# Patient Record
Sex: Female | Born: 1945 | Race: White | Hispanic: No | Marital: Married | Smoking: Former smoker
Health system: Southern US, Community
[De-identification: ages and names within clinical notes are randomized; demographics above are authoritative.]

## PROBLEM LIST (undated history)

## (undated) DIAGNOSIS — K219 Gastro-esophageal reflux disease without esophagitis: Secondary | ICD-10-CM

## (undated) DIAGNOSIS — H269 Unspecified cataract: Secondary | ICD-10-CM

## (undated) DIAGNOSIS — E079 Disorder of thyroid, unspecified: Secondary | ICD-10-CM

## (undated) DIAGNOSIS — T7840XA Allergy, unspecified, initial encounter: Secondary | ICD-10-CM

## (undated) HISTORY — DX: Unspecified cataract: H26.9

## (undated) HISTORY — PX: CYSTOCELE REPAIR: SHX163

## (undated) HISTORY — DX: Gastro-esophageal reflux disease without esophagitis: K21.9

## (undated) HISTORY — PX: TONSILLECTOMY: SUR1361

## (undated) HISTORY — PX: RECTOCELE REPAIR: SHX761

## (undated) HISTORY — DX: Allergy, unspecified, initial encounter: T78.40XA

---

## 2021-02-15 ENCOUNTER — Other Ambulatory Visit: Payer: Self-pay

## 2021-02-16 ENCOUNTER — Ambulatory Visit (INDEPENDENT_AMBULATORY_CARE_PROVIDER_SITE_OTHER): Payer: Medicare Other | Admitting: Nurse Practitioner

## 2021-02-16 ENCOUNTER — Encounter: Payer: Self-pay | Admitting: Nurse Practitioner

## 2021-02-16 VITALS — BP 106/60 | HR 70 | Temp 97.1°F | Ht 65.0 in | Wt 136.6 lb

## 2021-02-16 DIAGNOSIS — Z1231 Encounter for screening mammogram for malignant neoplasm of breast: Secondary | ICD-10-CM

## 2021-02-16 DIAGNOSIS — L821 Other seborrheic keratosis: Secondary | ICD-10-CM | POA: Insufficient documentation

## 2021-02-16 DIAGNOSIS — H43819 Vitreous degeneration, unspecified eye: Secondary | ICD-10-CM | POA: Insufficient documentation

## 2021-02-16 DIAGNOSIS — L720 Epidermal cyst: Secondary | ICD-10-CM | POA: Insufficient documentation

## 2021-02-16 DIAGNOSIS — E039 Hypothyroidism, unspecified: Secondary | ICD-10-CM | POA: Diagnosis not present

## 2021-02-16 DIAGNOSIS — G8929 Other chronic pain: Secondary | ICD-10-CM

## 2021-02-16 DIAGNOSIS — E7800 Pure hypercholesterolemia, unspecified: Secondary | ICD-10-CM

## 2021-02-16 DIAGNOSIS — J301 Allergic rhinitis due to pollen: Secondary | ICD-10-CM | POA: Insufficient documentation

## 2021-02-16 DIAGNOSIS — R5383 Other fatigue: Secondary | ICD-10-CM | POA: Diagnosis not present

## 2021-02-16 DIAGNOSIS — M810 Age-related osteoporosis without current pathological fracture: Secondary | ICD-10-CM

## 2021-02-16 DIAGNOSIS — L570 Actinic keratosis: Secondary | ICD-10-CM | POA: Insufficient documentation

## 2021-02-16 DIAGNOSIS — M5442 Lumbago with sciatica, left side: Secondary | ICD-10-CM

## 2021-02-16 NOTE — Assessment & Plan Note (Addendum)
Repeat lipid panel Normal BP No tobacco or ETOH use No hx of CAD/PAD No DM. She does not wish to take any medication unless numbers are very  high

## 2021-02-16 NOTE — Patient Instructions (Addendum)
Thank you for choosing [facility]  Schedule fasting lab appt. You will need to be fasting 6-8hrs prior to blood draw.  It is ok to drink water.  Sign medical release form to get records from previous pcp, gastroenterologist, orthopedist, and urogynecology.  Allergic Rhinitis, Adult  Allergic rhinitis is an allergic reaction that affects the mucous membrane inside the nose. The mucous membrane is the tissue that produces mucus. There are two types of allergic rhinitis:  Seasonal. This type is also called hay fever and happens only during certain seasons.  Perennial. This type can happen at any time of the year. Allergic rhinitis cannot be spread from person to person. This condition can be mild, moderate, or severe. It can develop at any age and may be outgrown. What are the causes? This condition is caused by allergens. These are things that can cause an allergic reaction. Allergens may differ for seasonal allergic rhinitis and perennial allergic rhinitis.  Seasonal allergic rhinitis is triggered by pollen. Pollen can come from grasses, trees, and weeds.  Perennial allergic rhinitis may be triggered by: ? Dust mites. ? Proteins in a pet's urine, saliva, or dander. Dander is dead skin cells from a pet. ? Smoke, mold, or car fumes. What increases the risk? You are more likely to develop this condition if you have a family history of allergies or other conditions related to allergies, including:  Allergic conjunctivitis. This is inflammation of parts of the eyes and eyelids.  Asthma. This condition affects the lungs and makes it hard to breathe.  Atopic dermatitis or eczema. This is long term (chronic) inflammation of the skin.  Food allergies. What are the signs or symptoms? Symptoms of this condition include:  Sneezing or coughing.  A stuffy nose (nasal congestion), itchy nose, or nasal discharge.  Itchy eyes and tearing of the eyes.  A feeling of mucus dripping  down the back of your throat (postnasal drip).  Trouble sleeping.  Tiredness or fatigue.  Headache.  Sore throat. How is this diagnosed? This condition may be diagnosed with your symptoms, medical history, and physical exam. Your health care provider may check for related conditions, such as:  Asthma.  Pink eye. This is eye inflammation caused by infection (conjunctivitis).  Ear infection.  Upper respiratory infection. This is an infection in the nose, throat, or upper airways. You may also have tests to find out which allergens trigger your symptoms. These may include skin tests or blood tests. How is this treated? There is no cure for this condition, but treatment can help control symptoms. Treatment may include:  Taking medicines that block allergy symptoms, such as corticosteroids and antihistamines. Medicine may be given as a shot, nasal spray, or pill.  Avoiding any allergens.  Being exposed again and again to tiny amounts of allergens to help you build a defense against allergens (immunotherapy). This is done if other treatments have not helped. It may include: ? Allergy shots. These are injected medicines that have small amounts of allergen in them. ? Sublingual immunotherapy. This involves taking small doses of a medicine with allergen in it under your tongue. If these treatments do not work, your health care provider may prescribe newer, stronger medicines. Follow these instructions at home: Avoiding allergens Find out what you are allergic to and avoid those allergens. These are some things you can do to help avoid allergens:  If you have perennial allergies: ? Replace carpet with wood, tile, or vinyl flooring. Carpet can trap dander  and dust. ? Do not smoke. Do not allow smoking in your home. ? Change your heating and air conditioning filters at least once a month.  If you have seasonal allergies, take these steps during allergy season: ? Keep windows closed as  much as possible. ? Plan outdoor activities when pollen counts are lowest. Check pollen counts before you plan outdoor activities. ? When coming indoors, change clothing and shower before sitting on furniture or bedding.  If you have a pet in the house that produces allergens: ? Keep the pet out of the bedroom. ? Vacuum, sweep, and dust regularly. General instructions  Take over-the-counter and prescription medicines only as told by your health care provider.  Drink enough fluid to keep your urine pale yellow.  Keep all follow-up visits as told by your health care provider. This is important. Where to find more information  American Academy of Allergy, Asthma & Immunology: www.aaaai.org Contact a health care provider if:  You have a fever.  You develop a cough that does not go away.  You make whistling sounds when you breathe (wheeze).  Your symptoms slow you down or stop you from doing your normal activities each day. Get help right away if:  You have shortness of breath. This symptom may represent a serious problem that is an emergency. Do not wait to see if the symptom will go away. Get medical help right away. Call your local emergency services (911 in the [place] Do not drive yourself to the hospital. Summary  Allergic rhinitis may be managed by taking medicines as directed and avoiding allergens.  If you have seasonal allergies, keep windows closed as much as possible during allergy season.  Contact your health care provider if you develop a fever or a cough that does not go away. This information is not intended to replace advice given to you by your health care provider. Make sure you discuss any questions you have with your health care provider. Document Revised: 11/21/2019 Document Reviewed: 09/30/2019 Elsevier Patient Education  2021 [employer].

## 2021-02-16 NOTE — Assessment & Plan Note (Signed)
Stable with benadryl in PM and zyrtec in AM Side effects dry mouth and skin, possible cause of fatigue? Declined to switch to montelukast at this time.

## 2021-02-16 NOTE — Assessment & Plan Note (Signed)
Diagnosed about 64yrs ago, no previous thyroid US Takes 3mcg on MWF and 94mcg TThSatSun. Last OV 51months ago per patient Repeat TSH, T4 and T3

## 2021-02-16 NOTE — Progress Notes (Signed)
Subjective:  Patient ID: Dawn Norris, female    DOB: 11/28/1945  Age: 75 y.o. MRN: [id number]  CC: Establish Care (New patient/Pt would like to discuss thyroid and allergies. )  HPI  Seasonal allergic rhinitis due to pollen Stable with benadryl in PM and zyrtec in AM Side effects dry mouth and skin, possible cause of fatigue? Declined to switch to montelukast at this time.  Hypothyroidism Diagnosed about 35yrs ago, no previous thyroid US Takes 44mcg on MWF and 37mcg TThSatSun. Last OV 19months ago per patient Repeat TSH, T4 and T3  Osteoporosis She reports no improvement with fosamax and prolia She does not want to try another medication nor repeat dexa scan. She take calcuim and vitamin supplement. She also maintain daily weight bearing exercise Denies any fall in last 78yr. No previous bone fractures.  Pure hypercholesterolemia Repeat lipid panel Normal BP No tobacco or ETOH use No hx of CAD/PAD No DM. She does not wish to take any medication unless numbers are very  high  Chronic left-sided low back pain Ongoing for years, intermittent, not related to any trauma, has hx of osteoporosis. prevous eval by ortho: x-ray and MRI done in past, no fracture  or DDD per patient. Had injections and outpatient PT in past with no improvement. Worse in supine position Currently managed to tylenol PM She is not interested in another ortho referral and/or additional medication at this time.   Reviewed past Medical, Social and Family history today.  Outpatient Medications Prior to Visit  Medication Sig Dispense Refill  . Ibuprofen-diphenhydrAMINE Cit 200-38 MG TABS Take by mouth.    . levothyroxine (SYNTHROID) 50 MCG tablet Take 50 mcg by mouth daily.    Marland Kitchen levothyroxine (SYNTHROID) 75 MCG tablet TAKE 1 TABLET BY MOUTH DAILY IN THE MORNING ON AN EMPTY STOMACH     No facility-administered medications prior to visit.    ROS See HPI  Objective:  BP 106/60 (BP Location: Left  Arm, Patient Position: Sitting, Cuff Size: Normal)   Pulse 70   Temp (!) 97.1 F (36.2 C) (Temporal)   Ht 5\' 5"  (1.651 m)   Wt 136 lb 9.6 oz (62 kg)   SpO2 97%   BMI 22.73 kg/m   Physical Exam Neck:     Thyroid: No thyroid mass, thyromegaly or thyroid tenderness.  Cardiovascular:     Rate and Rhythm: Normal rate and regular rhythm.     Pulses: Normal pulses.     Heart sounds: Normal heart sounds.  Pulmonary:     Effort: Pulmonary effort is normal.     Breath sounds: Normal breath sounds.  Musculoskeletal:     Cervical back: Normal range of motion and neck supple.     Right lower leg: No edema.     Left lower leg: No edema.  Lymphadenopathy:     Cervical: No cervical adenopathy.  Neurological:     Mental Status: She is alert and oriented to person, place, and time.     Gait: Gait normal.    Assessment & Plan:  This visit occurred during the SARS-CoV-2 public health emergency.  Safety protocols were in place, including screening questions prior to the visit, additional usage of staff PPE, and extensive cleaning of exam room while observing appropriate contact time as indicated for disinfecting solutions.   [name] was seen today for establish care.  Diagnoses and all orders for this visit:  Hypothyroidism, unspecified type -     Comprehensive metabolic panel; Future -  TSH; Future -     T4, free; Future -     T3, free; Future  Seasonal allergic rhinitis due to pollen  Pure hypercholesterolemia -     Lipid panel; Future  Fatigue, unspecified type -     CBC with Differential/Platelet; Future -     Comprehensive metabolic panel; Future  Breast cancer screening by mammogram -     MM 3D SCREEN BREAST BILATERAL; Future  Age-related osteoporosis without current pathological fracture  Chronic left-sided low back pain with left-sided sciatica   Problem List Items Addressed This Visit      Respiratory   Seasonal allergic rhinitis due to pollen    Stable with  benadryl in PM and zyrtec in AM Side effects dry mouth and skin, possible cause of fatigue? Declined to switch to montelukast at this time.        Endocrine   Hypothyroidism - Primary    Diagnosed about 29yrs ago, no previous thyroid US Takes 60mcg on MWF and 16mcg TThSatSun. Last OV 42months ago per patient Repeat TSH, T4 and T3      Relevant Medications   levothyroxine (SYNTHROID) 50 MCG tablet   levothyroxine (SYNTHROID) 75 MCG tablet   Other Relevant Orders   Comprehensive metabolic panel   TSH   T4, free   T3, free     Musculoskeletal and Integument   Osteoporosis    She reports no improvement with fosamax and prolia She does not want to try another medication nor repeat dexa scan. She take calcuim and vitamin supplement. She also maintain daily weight bearing exercise Denies any fall in last 44yr. No previous bone fractures.        Other   Chronic left-sided low back pain    Ongoing for years, intermittent, not related to any trauma, has hx of osteoporosis. prevous eval by ortho: x-ray and MRI done in past, no fracture  or DDD per patient. Had injections and outpatient PT in past with no improvement. Worse in supine position Currently managed to tylenol PM She is not interested in another ortho referral and/or additional medication at this time.      Pure hypercholesterolemia    Repeat lipid panel Normal BP No tobacco or ETOH use No hx of CAD/PAD No DM. She does not wish to take any medication unless numbers are very  high      Relevant Orders   Lipid panel    Other Visit Diagnoses    Fatigue, unspecified type       Relevant Orders   CBC with Differential/Platelet   Comprehensive metabolic panel   Breast cancer screening by mammogram       Relevant Orders   MM 3D SCREEN BREAST BILATERAL      Follow-up: Return in about 6 months (around 08/19/2021) for Hypothyroididm and , hyperlipidemia (fasting).  [name], NP

## 2021-02-16 NOTE — Assessment & Plan Note (Signed)
She reports no improvement with fosamax and prolia She does not want to try another medication nor repeat dexa scan. She take calcuim and vitamin supplement. She also maintain daily weight bearing exercise Denies any fall in last 42yr. No previous bone fractures.

## 2021-02-17 DIAGNOSIS — M545 Low back pain, unspecified: Secondary | ICD-10-CM | POA: Insufficient documentation

## 2021-02-17 DIAGNOSIS — G8929 Other chronic pain: Secondary | ICD-10-CM | POA: Insufficient documentation

## 2021-02-17 NOTE — Assessment & Plan Note (Signed)
Ongoing for years, intermittent, not related to any trauma, has hx of osteoporosis. prevous eval by ortho: x-ray and MRI done in past, no fracture  or DDD per patient. Had injections and outpatient PT in past with no improvement. Worse in supine position Currently managed to tylenol PM She is not interested in another ortho referral and/or additional medication at this time.

## 2021-02-18 ENCOUNTER — Other Ambulatory Visit (INDEPENDENT_AMBULATORY_CARE_PROVIDER_SITE_OTHER): Payer: Medicare Other

## 2021-02-18 ENCOUNTER — Other Ambulatory Visit: Payer: Self-pay

## 2021-02-18 DIAGNOSIS — R5383 Other fatigue: Secondary | ICD-10-CM

## 2021-02-18 DIAGNOSIS — E7800 Pure hypercholesterolemia, unspecified: Secondary | ICD-10-CM

## 2021-02-18 DIAGNOSIS — E039 Hypothyroidism, unspecified: Secondary | ICD-10-CM

## 2021-02-18 LAB — CBC WITH DIFFERENTIAL/PLATELET
Basophils Absolute: 0 10*3/uL (ref 0.0–0.1)
Basophils Relative: 0.8 % (ref 0.0–3.0)
Eosinophils Absolute: 0.1 10*3/uL (ref 0.0–0.7)
Eosinophils Relative: 2.4 % (ref 0.0–5.0)
HCT: 41.9 % (ref 36.0–46.0)
Hemoglobin: 14.1 g/dL (ref 12.0–15.0)
Lymphocytes Relative: 41 % (ref 12.0–46.0)
Lymphs Abs: 2.1 10*3/uL (ref 0.7–4.0)
MCHC: 33.6 g/dL (ref 30.0–36.0)
MCV: 93.5 fl (ref 78.0–100.0)
Monocytes Absolute: 0.5 10*3/uL (ref 0.1–1.0)
Monocytes Relative: 9.6 % (ref 3.0–12.0)
Neutro Abs: 2.4 10*3/uL (ref 1.4–7.7)
Neutrophils Relative %: 46.2 % (ref 43.0–77.0)
Platelets: 284 10*3/uL (ref 150.0–400.0)
RBC: 4.48 Mil/uL (ref 3.87–5.11)
RDW: 13.2 % (ref 11.5–15.5)
WBC: 5.2 10*3/uL (ref 4.0–10.5)

## 2021-02-18 LAB — LIPID PANEL
Cholesterol: 219 mg/dL — ABNORMAL HIGH (ref 0–200)
HDL: 61.7 mg/dL (ref >39.00)
LDL Cholesterol: 141 mg/dL — ABNORMAL HIGH (ref 0–99)
NonHDL: 157.75
Total CHOL/HDL Ratio: 4
Triglycerides: 82 mg/dL (ref 0.0–149.0)
VLDL: 16.4 mg/dL (ref 0.0–40.0)

## 2021-02-18 LAB — COMPREHENSIVE METABOLIC PANEL
ALT: 13 U/L (ref 0–35)
AST: 17 U/L (ref 0–37)
Albumin: 4.3 g/dL (ref 3.5–5.2)
Alkaline Phosphatase: 57 U/L (ref 39–117)
BUN: 20 mg/dL (ref 6–23)
CO2: 30 mEq/L (ref 19–32)
Calcium: 9.5 mg/dL (ref 8.4–10.5)
Chloride: 104 mEq/L (ref 96–112)
Creatinine, Ser: 0.88 mg/dL (ref 0.40–1.20)
GFR: 64.68 mL/min (ref >60.00)
Glucose, Bld: 91 mg/dL (ref 70–99)
Potassium: 4.8 mEq/L (ref 3.5–5.1)
Sodium: 141 mEq/L (ref 135–145)
Total Bilirubin: 1 mg/dL (ref 0.2–1.2)
Total Protein: 6.8 g/dL (ref 6.0–8.3)

## 2021-02-18 LAB — T3, FREE: T3, Free: 2.8 pg/mL (ref 2.3–4.2)

## 2021-02-18 LAB — TSH: TSH: 1.45 u[IU]/mL (ref 0.35–4.50)

## 2021-02-18 LAB — T4, FREE: Free T4: 0.92 ng/dL (ref 0.60–1.60)

## 2021-02-18 MED ORDER — LEVOTHYROXINE SODIUM 75 MCG PO TABS: ORAL_TABLET | ORAL | 1 refills | Status: DC

## 2021-02-18 MED ORDER — LEVOTHYROXINE SODIUM 50 MCG PO TABS: ORAL_TABLET | ORAL | 1 refills | Status: DC

## 2021-02-18 NOTE — Progress Notes (Signed)
Per orders of NP [name] pt is here for labs pt tolerated draw well

## 2021-02-18 NOTE — Addendum Note (Signed)
Addended by: Leana Gamer on: 02/18/2021 08:47 PM   Modules accepted: Orders

## 2021-03-11 ENCOUNTER — Other Ambulatory Visit: Payer: Self-pay

## 2021-03-11 ENCOUNTER — Encounter (HOSPITAL_COMMUNITY): Payer: Self-pay

## 2021-03-11 ENCOUNTER — Ambulatory Visit (HOSPITAL_COMMUNITY)
Admission: RE | Admit: 2021-03-11 | Discharge: 2021-03-11 | Disposition: A | Discharge status: Home / Self Care | Payer: Medicare Other | Source: Ambulatory Visit | Attending: Family Medicine | Admitting: Family Medicine

## 2021-03-11 VITALS — BP 128/54 | HR 66 | Temp 99.4°F | Resp 16

## 2021-03-11 DIAGNOSIS — J069 Acute upper respiratory infection, unspecified: Secondary | ICD-10-CM

## 2021-03-11 MED ORDER — AZELASTINE HCL 0.1 % NA SOLN: 1.0000 | Freq: Two times a day (BID) | NASAL | 0 refills | Status: DC

## 2021-03-11 NOTE — ED Triage Notes (Signed)
Pt c/o sneezing and sore in her mouth since this morning. Pt states she has not had much sleep. She states she currently has a headache.

## 2021-03-11 NOTE — ED Provider Notes (Signed)
[facility]    CSN: [id number] Arrival date & time: 03/11/21  1037      History   Chief Complaint Chief Complaint  Patient presents with  . Appointment  . Headache  . Mouth Lesions   HPI Dawn Norris is a 75 y.o. female.   Presenting today with 3-day history of sneezing, runny nose, sinus pressure, scratchy throat, headache.  She also developed some sores in her mouth this morning that are new.  She denies fever, chills, body aches, chest pain, shortness of breath, abdominal pain, nausea vomiting or diarrhea.  So far trying some over-the-counter cold and sinus pills and her typical allergy medication.  These do not seem to be helping much.  Home COVID test has been negative.    History reviewed. No pertinent past medical history.  Patient Active Problem List   Diagnosis Date Noted  . Chronic left-sided low back pain 02/17/2021  . Actinic keratosis 02/16/2021  . Hypothyroidism 02/16/2021  . Milium 02/16/2021  . Osteoporosis 02/16/2021  . Vitreous detachment 02/16/2021  . Seasonal allergic rhinitis due to pollen 02/16/2021  . Pure hypercholesterolemia 02/16/2021    History reviewed. No pertinent surgical history.  OB History   No obstetric history on file.      Home Medications    Prior to Admission medications   Medication Sig Start Date End Date Taking? Authorizing Provider  azelastine (ASTELIN) 0.1 % nasal spray Place 1 spray into both nostrils 2 (two) times daily. Use in each nostril as directed 03/11/21  Yes Volney American, PA-C  Ibuprofen-diphenhydrAMINE Cit 200-38 MG TABS Take by mouth.    [provider]  levothyroxine (SYNTHROID) 50 MCG tablet Takes on Tues, Thurs, Sat, Sun 02/18/21   Flossie Buffy, NP  levothyroxine (SYNTHROID) 75 MCG tablet Takes on MWF 02/18/21   Nche, Charlene Brooke, NP    Family History History reviewed. No pertinent family history.  Social History Social History   Tobacco Use  . Smoking status:  Never Smoker  . Smokeless tobacco: Never Used  Vaping Use  . Vaping Use: Never used  Substance Use Topics  . Alcohol use: Yes    Comment: A glass of wine a night  . Drug use: Never     Allergies   Nitrofurantoin, Codeine, Epinephrine, Erythromycin, and Morphine   Review of Systems Review of Systems Per HPI  Physical Exam Triage Vital Signs ED Triage Vitals  Enc Vitals Group     BP 03/11/21 1102 (!) 128/54     Pulse Rate 03/11/21 1100 66     Resp 03/11/21 1100 16     Temp 03/11/21 1100 99.4 F (37.4 C)     Temp Source 03/11/21 1100 Oral     SpO2 03/11/21 1100 98 %     Weight --      Height --      Head Circumference --      Peak Flow --      Pain Score 03/11/21 1059 3     Pain Loc --      Pain Edu? --      Excl. in [place]? --    No data found.  Updated Vital Signs BP (!) 128/54   Pulse 66   Temp 99.4 F (37.4 C) (Oral)   Resp 16   SpO2 98%   Visual Acuity Right Eye Distance:   Left Eye Distance:   Bilateral Distance:    Right Eye Near:   Left Eye Near:  Bilateral Near:     Physical Exam Vitals and nursing note reviewed.  Constitutional:      Appearance: Normal appearance. She is not ill-appearing.  HENT:     Head: Atraumatic.     Right Ear: Tympanic membrane normal.     Left Ear: Tympanic membrane normal.     Nose: Rhinorrhea present.     Mouth/Throat:     Mouth: Mucous membranes are moist.     Pharynx: Posterior oropharyngeal erythema present. No oropharyngeal exudate.  Eyes:     Extraocular Movements: Extraocular movements intact.     Conjunctiva/sclera: Conjunctivae normal.  Cardiovascular:     Rate and Rhythm: Normal rate and regular rhythm.     Heart sounds: Normal heart sounds.  Pulmonary:     Effort: Pulmonary effort is normal. No respiratory distress.     Breath sounds: Normal breath sounds. No wheezing or rales.  Abdominal:     General: Bowel sounds are normal. There is no distension.     Palpations: Abdomen is soft.      Tenderness: There is no abdominal tenderness. There is no guarding.  Musculoskeletal:        General: Normal range of motion.     Cervical back: Normal range of motion and neck supple.  Skin:    General: Skin is warm and dry.  Neurological:     Mental Status: She is alert and oriented to person, place, and time.  Psychiatric:        Mood and Affect: Mood normal.        Thought Content: Thought content normal.        Judgment: Judgment normal.      UC Treatments / Results  Labs (all labs ordered are listed, but only abnormal results are displayed) Labs Reviewed - No data to display  EKG   Radiology No results found.  Procedures Procedures (including critical care time)  Medications Ordered in UC Medications - No data to display  Initial Impression / Assessment and Plan / UC Course  I have reviewed the triage vital signs and the nursing notes.  Pertinent labs & imaging results that were available during my care of the patient were reviewed by me and considered in my medical decision making (see chart for details).     Suspect viral illness, exam and vital signs very reassuring today.  Declines further respiratory testing as home COVID test was negative, discussed isolation protocol, strict return precautions.  Will give Astelin nasal spray as she cannot tolerate Flonase and continue taking her over-the-counter cold and congestion medication.  Follow-up if acutely worsening symptoms.  Final Clinical Impressions(s) / UC Diagnoses   Final diagnoses:  Viral URI with cough   Discharge Instructions   None    ED Prescriptions    Medication Sig Dispense Auth. Provider   azelastine (ASTELIN) 0.1 % nasal spray Place 1 spray into both nostrils 2 (two) times daily. Use in each nostril as directed 30 mL Volney American, PA-C     PDMP not reviewed this encounter.   Volney American, [place] 03/11/21 1417

## 2021-06-26 ENCOUNTER — Telehealth: Payer: Self-pay

## 2021-06-26 NOTE — Telephone Encounter (Signed)
Pt has declined to schedule appointment at this time.

## 2021-08-19 ENCOUNTER — Ambulatory Visit: Payer: Medicare Other | Admitting: Nurse Practitioner

## 2021-08-25 ENCOUNTER — Other Ambulatory Visit: Payer: Self-pay

## 2021-08-25 ENCOUNTER — Ambulatory Visit
Admission: RE | Admit: 2021-08-25 | Discharge: 2021-08-25 | Disposition: A | Discharge status: Home / Self Care | Payer: Medicare Other | Source: Ambulatory Visit | Attending: Nurse Practitioner | Admitting: Nurse Practitioner

## 2021-08-25 DIAGNOSIS — Z1231 Encounter for screening mammogram for malignant neoplasm of breast: Secondary | ICD-10-CM

## 2021-09-30 ENCOUNTER — Ambulatory Visit (INDEPENDENT_AMBULATORY_CARE_PROVIDER_SITE_OTHER): Payer: Medicare Other | Admitting: Nurse Practitioner

## 2021-09-30 ENCOUNTER — Other Ambulatory Visit: Payer: Self-pay

## 2021-09-30 ENCOUNTER — Encounter: Payer: Self-pay | Admitting: Nurse Practitioner

## 2021-09-30 VITALS — BP 110/70 | HR 76 | Temp 97.0°F | Ht 65.0 in | Wt 138.2 lb

## 2021-09-30 DIAGNOSIS — E039 Hypothyroidism, unspecified: Secondary | ICD-10-CM

## 2021-09-30 DIAGNOSIS — C44719 Basal cell carcinoma of skin of left lower limb, including hip: Secondary | ICD-10-CM | POA: Insufficient documentation

## 2021-09-30 DIAGNOSIS — E7800 Pure hypercholesterolemia, unspecified: Secondary | ICD-10-CM

## 2021-09-30 NOTE — Assessment & Plan Note (Signed)
Stable with current levothyroxine doses Repeat TSH, T4 and T3 Maintain current doses at this time

## 2021-09-30 NOTE — Assessment & Plan Note (Signed)
Repeat lipid panel No HX of CAD, no tobacco use, no DM or HTN. BP Readings from Last 3 Encounters:  09/30/21 110/70  03/11/21 (!) 128/54  02/16/21 106/60   Wt Readings from Last 3 Encounters:  09/30/21 138 lb 3.2 oz (62.7 kg)  02/16/21 136 lb 9.6 oz (62 kg)

## 2021-09-30 NOTE — Progress Notes (Signed)
Subjective:  Patient ID: Dawn Norris, female    DOB: 1946/07/12  Age: 75 y.o. MRN: [id number]  CC: Follow-up (6 month f/u on thyroid and cholesterol. /Pt is not fasting. /Declines all vaccines/)  HPI  Hypothyroidism Stable with current levothyroxine doses Repeat TSH, T4 and T3 Maintain current doses at this time  Pure hypercholesterolemia Repeat lipid panel No HX of CAD, no tobacco use, no DM or HTN. BP Readings from Last 3 Encounters:  09/30/21 110/70  03/11/21 (!) 128/54  02/16/21 106/60   Wt Readings from Last 3 Encounters:  09/30/21 138 lb 3.2 oz (62.7 kg)  02/16/21 136 lb 9.6 oz (62 kg)   Wt Readings from Last 3 Encounters:  09/30/21 138 lb 3.2 oz (62.7 kg)  02/16/21 136 lb 9.6 oz (62 kg)    Reviewed past Medical, Social and Family history today.  Outpatient Medications Prior to Visit  Medication Sig Dispense Refill   Ibuprofen-diphenhydrAMINE Cit 200-38 MG TABS Take by mouth.     levothyroxine (SYNTHROID) 50 MCG tablet Takes on Tues, Thurs, Sat, Sun 42 tablet 1   levothyroxine (SYNTHROID) 75 MCG tablet Takes on MWF 36 tablet 1   azelastine (ASTELIN) 0.1 % nasal spray Place 1 spray into both nostrils 2 (two) times daily. Use in each nostril as directed (Patient not taking: Reported on 09/30/2021) 30 mL 0   No facility-administered medications prior to visit.    ROS See HPI  Objective:  BP 110/70 (BP Location: Left Arm, Patient Position: Sitting, Cuff Size: Normal)    Pulse 76    Temp (!) 97 F (36.1 C) (Temporal)    Ht 5\' 5"  (1.651 m)    Wt 138 lb 3.2 oz (62.7 kg)    SpO2 98%    BMI 23.00 kg/m   Physical Exam Vitals reviewed.  Cardiovascular:     Rate and Rhythm: Normal rate and regular rhythm.     Pulses: Normal pulses.     Heart sounds: Normal heart sounds.  Pulmonary:     Effort: Pulmonary effort is normal.     Breath sounds: Normal breath sounds.  Musculoskeletal:     Right lower leg: No edema.     Left lower leg: No edema.  Neurological:      Mental Status: She is alert and oriented to person, place, and time.   Assessment & Plan:  This visit occurred during the SARS-CoV-2 public health emergency.  Safety protocols were in place, including screening questions prior to the visit, additional usage of staff PPE, and extensive cleaning of exam room while observing appropriate contact time as indicated for disinfecting solutions.   Dawn Norris was seen today for follow-up.  Diagnoses and all orders for this visit:  Hypothyroidism, unspecified type -     T4, free; Future -     TSH; Future -     T3, free; Future  Pure hypercholesterolemia -     Lipid panel; Future  Problem List Items Addressed This Visit       Endocrine   Hypothyroidism - Primary    Stable with current levothyroxine doses Repeat TSH, T4 and T3 Maintain current doses at this time      Relevant Orders   T4, free   TSH   T3, free     Other   Pure hypercholesterolemia    Repeat lipid panel No HX of CAD, no tobacco use, no DM or HTN. BP Readings from Last 3 Encounters:  09/30/21 110/70  03/11/21 (!) 128/54  02/16/21 106/60   Wt Readings from Last 3 Encounters:  09/30/21 138 lb 3.2 oz (62.7 kg)  02/16/21 136 lb 9.6 oz (62 kg)        Relevant Orders   Lipid panel    Follow-up: Return in about 6 months (around 03/31/2022) for hyperlipidemia and hypothyrodism (fasting).  Dawn Lacy, NP

## 2021-09-30 NOTE — Patient Instructions (Signed)
Schedule lab appt. Need to be fasting 8hrs prior to blood draw. Ok to drink water.

## 2021-10-04 ENCOUNTER — Ambulatory Visit
Admission: EM | Admit: 2021-10-04 | Discharge: 2021-10-04 | Disposition: A | Discharge status: Home / Self Care | Payer: Medicare Other | Attending: Emergency Medicine | Admitting: Emergency Medicine

## 2021-10-04 ENCOUNTER — Other Ambulatory Visit: Payer: Self-pay

## 2021-10-04 ENCOUNTER — Encounter: Payer: Self-pay | Admitting: *Deleted

## 2021-10-04 DIAGNOSIS — N390 Urinary tract infection, site not specified: Secondary | ICD-10-CM

## 2021-10-04 DIAGNOSIS — K5904 Chronic idiopathic constipation: Secondary | ICD-10-CM | POA: Diagnosis present

## 2021-10-04 DIAGNOSIS — R350 Frequency of micturition: Secondary | ICD-10-CM | POA: Insufficient documentation

## 2021-10-04 DIAGNOSIS — Z9889 Other specified postprocedural states: Secondary | ICD-10-CM | POA: Diagnosis present

## 2021-10-04 DIAGNOSIS — R319 Hematuria, unspecified: Secondary | ICD-10-CM | POA: Diagnosis present

## 2021-10-04 DIAGNOSIS — R82998 Other abnormal findings in urine: Secondary | ICD-10-CM | POA: Diagnosis present

## 2021-10-04 DIAGNOSIS — Z87448 Personal history of other diseases of urinary system: Secondary | ICD-10-CM | POA: Insufficient documentation

## 2021-10-04 LAB — POCT URINALYSIS DIP (MANUAL ENTRY)
Bilirubin, UA: NEGATIVE
Glucose, UA: NEGATIVE mg/dL
Ketones, POC UA: NEGATIVE mg/dL
Nitrite, UA: NEGATIVE
Protein Ur, POC: NEGATIVE mg/dL
Spec Grav, UA: 1.01 (ref 1.010–1.025)
Urobilinogen, UA: 0.2 E.U./dL
pH, UA: 6 (ref 5.0–8.0)

## 2021-10-04 MED ORDER — CIPROFLOXACIN HCL 250 MG PO TABS: 250.0000 mg | ORAL_TABLET | Freq: Two times a day (BID) | ORAL | 0 refills | Status: AC

## 2021-10-04 MED ORDER — PSYLLIUM FIBER 0.52 G PO CAPS: 1.0000 | ORAL_CAPSULE | Freq: Two times a day (BID) | ORAL | 0 refills | Status: DC

## 2021-10-04 NOTE — Discharge Instructions (Addendum)
The urinalysis that we performed today was abnormal.  Urine culture will be performed per our protocol.  The results of urine culture should be available in the next 3 to 5 days and will be posted to your MyChart account.  If there are any abnormal results, you will be contacted by phone and if further treatment is needed, this will be provided for you.  If you were advised to begin antibiotics today, it is because you are having active symptoms of a urinary tract infection.  It is important that you complete the course of antibiotics as prescribed despite the results of your urine culture.    The most common reason for a negative urine culture, despite having active symptoms of urinary tract infection, is that a truly reliable urine sample can only be obtained with the first urination of the day.  That early morning urine sample is very concentrated so it is most likely to have a significant amount of bacteria, enough to provide a positive urine culture.  Throughout the day, as you drink fluids and consume foods, your urine becomes more diluted.  Diluted urine decreases the likelihood of a negative urine culture.  Therefore, if you begin to have significant relief of your symptoms when you begin the antibiotics but receive a phone call stating that your urine culture was negative, please trust the improvement of your symptoms as a sign that the antibiotics have been effective and that you should complete the entire course.  For your chronic idiopathic constipation, I recommend that you begin psyllium capsules, 1 capsule twice daily.  I recommend that you look for a capsule that is at least 400 mg/caps.  I provided you with a prescription in the small hope that your insurance will cover it but if they do not, at least you know what to look for on the shelf at your pharmacy.  I have included the name of Dr. Dorien Chihuahua at [place].  Please feel free to reach out to her for  consultation regarding your medical history and recurrent urinary tract infections.  Thank you for visiting urgent care today.  I hope you feel better soon.

## 2021-10-04 NOTE — ED Provider Notes (Signed)
[facility]    CSN: [id number] Arrival date & time: 10/04/21  1014    HISTORY   Chief Complaint  Patient presents with   UTI   HPI Dawn Norris is a 75 y.o. female. Patient presents today stating she has a urinary tract infection.  Patient states she noticed this morning that she had increased frequency of urination and some mild burning discomfort with urination.  Patient states the last UTI she had was about a year ago, states that she used to get them very frequently, previously was followed regularly by Uro/GYN for this issue, patient states she has a history of cystocele and rectocele repair, states she thinks this is sometime in 2007, states at the time they used mesh to perform the repair and states that her Uro/GYN provider advised her that the mesh starting to fail but that the area of defect is too small to warrant surgical intervention.  Patient states that they have never discussed putting her on prophylactic antibiotics for UTI prevention.  Patient states since moving to [place] from [place] a year ago, she has not had any another urinary tract infection so has not attempted to find a new provider in this area.  The history is provided by the patient.  History reviewed. No pertinent past medical history. Patient Active Problem List   Diagnosis Date Noted   Basal cell carcinoma of left lower leg 09/30/2021   Chronic left-sided low back pain 02/17/2021   Actinic keratosis 02/16/2021   Hypothyroidism 02/16/2021   Milium 02/16/2021   Osteoporosis 02/16/2021   Vitreous detachment 02/16/2021   Seasonal allergic rhinitis due to pollen 02/16/2021   Pure hypercholesterolemia 02/16/2021   History reviewed. No pertinent surgical history. OB History   No obstetric history on file.    Home Medications    Prior to Admission medications   Medication Sig Start Date End Date Taking? Authorizing Provider  Ibuprofen-diphenhydrAMINE Cit 200-38 MG TABS Take by mouth.     [provider]  levothyroxine (SYNTHROID) 50 MCG tablet Takes on Tues, Thurs, Sat, Sun 02/18/21   Flossie Buffy, NP  levothyroxine (SYNTHROID) 75 MCG tablet Takes on MWF 02/18/21   Nche, Charlene Brooke, NP   Family History History reviewed. No pertinent family history. Social History Social History   Tobacco Use   Smoking status: Never   Smokeless tobacco: Never  Vaping Use   Vaping Use: Never used  Substance Use Topics   Alcohol use: Yes    Comment: A glass of wine a night   Drug use: Never   Allergies   Nitrofurantoin, Codeine, Epinephrine, Erythromycin, and Morphine  Review of Systems Review of Systems Pertinent findings noted in history of present illness.   Physical Exam Triage Vital Signs ED Triage Vitals  Enc Vitals Group     BP 08/12/21 0827 (!) 147/82     Pulse Rate 08/12/21 0827 72     Resp 08/12/21 0827 18     Temp 08/12/21 0827 98.3 F (36.8 C)     Temp Source 08/12/21 0827 Oral     SpO2 08/12/21 0827 98 %     Weight --      Height --      Head Circumference --      Peak Flow --      Pain Score 08/12/21 0826 5     Pain Loc --      Pain Edu? --      Excl. in GC? --   No  data found.  Updated Vital Signs BP 121/65    Pulse 60    Temp 98.8 F (37.1 C)    Resp 18    SpO2 96%   Physical Exam Vitals and nursing note reviewed.  Constitutional:      General: She is not in acute distress.    Appearance: Normal appearance. She is not ill-appearing.  HENT:     Head: Normocephalic and atraumatic.  Eyes:     General: Lids are normal.        Right eye: No discharge.        Left eye: No discharge.     Extraocular Movements: Extraocular movements intact.     Conjunctiva/sclera: Conjunctivae normal.     Right eye: Right conjunctiva is not injected.     Left eye: Left conjunctiva is not injected.  Neck:     Trachea: Trachea and phonation normal.  Cardiovascular:     Rate and Rhythm: Normal rate and regular rhythm.     Pulses: Normal pulses.      Heart sounds: Normal heart sounds. No murmur heard.   No friction rub. No gallop.  Pulmonary:     Effort: Pulmonary effort is normal. No accessory muscle usage, prolonged expiration or respiratory distress.     Breath sounds: Normal breath sounds. No stridor, decreased air movement or transmitted upper airway sounds. No decreased breath sounds, wheezing, rhonchi or rales.  Chest:     Chest wall: No tenderness.  Abdominal:     General: Abdomen is flat. Bowel sounds are normal. There is no distension.     Palpations: Abdomen is soft.     Tenderness: There is abdominal tenderness in the suprapubic area. There is no right CVA tenderness or left CVA tenderness.     Hernia: No hernia is present.  Genitourinary:    Comments: Patient politely declines pelvic exam today Musculoskeletal:        General: Normal range of motion.     Cervical back: Normal range of motion and neck supple. Normal range of motion.  Lymphadenopathy:     Cervical: No cervical adenopathy.  Skin:    General: Skin is warm and dry.     Findings: No erythema or rash.  Neurological:     General: No focal deficit present.     Mental Status: She is alert and oriented to person, place, and time.  Psychiatric:        Mood and Affect: Mood normal.        Behavior: Behavior normal.    Visual Acuity Right Eye Distance:   Left Eye Distance:   Bilateral Distance:    Right Eye Near:   Left Eye Near:    Bilateral Near:     UC Couse / Diagnostics / Procedures:    EKG  Radiology No results found.  Procedures Procedures (including critical care time)  UC Diagnoses / Final Clinical Impressions(s)   I have reviewed the triage vital signs and the nursing notes.  Pertinent labs & imaging results that were available during my care of the patient were reviewed by me and considered in my medical decision making (see chart for details).    Final diagnoses:  Acute lower urinary tract infection  Increased frequency of  urination  Hematuria, unspecified type  Leukocytes in urine  History of reconstructive repair of rectocele  History of cystocele  Chronic idiopathic constipation   Urine dip today was positive.  Urine culture will be performed per our protocol.  Patient advised that they will be contacted with results and that adjustments to treatment will be provided as indicated based on the results.    Patient was advised of possibility that urine culture results may be negative if sample provided was obtained late in the day causing urine to be more diluted.  Patient was advised that if antibiotics were effective after the first 24 to 36 hours, despite negative urine culture result, it is recommended that they complete the full course as prescribed.  Return precautions advised.  Drug allergies reviewed, all questions addressed.  Patient provided with contact information for local gynecologist office.  Patient encouraged to follow-up as soon as possible for consultation and thorough evaluation.    ED Prescriptions     Medication Sig Dispense Auth. Provider   ciprofloxacin (CIPRO) 250 MG tablet Take 1 tablet (250 mg total) by mouth every 12 (twelve) hours for 5 days. 10 tablet Lynden Oxford Scales, PA-C   Psyllium Fiber 0.52 g CAPS Take 1 capsule by mouth in the morning and at bedtime. 180 capsule Lynden Oxford Scales, PA-C      PDMP not reviewed this encounter.  Pending results:  Labs Reviewed  POCT URINALYSIS DIP (MANUAL ENTRY) - Abnormal; Notable for the following components:      Result Value   Clarity, UA cloudy (*)    Blood, UA moderate (*)    Leukocytes, UA Small (1+) (*)    All other components within normal limits  URINE CULTURE    Medications Ordered in UC: Medications - No data to display  Disposition Upon Discharge:  Condition: stable for discharge home Home: take medications as prescribed; routine discharge instructions as discussed; follow up as advised.  Patient presented  with an acute illness with associated systemic symptoms and significant discomfort requiring urgent management. In my opinion, this is a condition that a prudent lay person (someone who possesses an average knowledge of health and medicine) may potentially expect to result in complications if not addressed urgently such as respiratory distress, impairment of bodily function or dysfunction of bodily organs.   Routine symptom specific, illness specific and/or disease specific instructions were discussed with the patient and/or caregiver at length.   As such, the patient has been evaluated and assessed, work-up was performed and treatment was provided in alignment with urgent care protocols and evidence based medicine.  Patient/parent/caregiver has been advised that the patient may require follow up for further testing and treatment if the symptoms continue in spite of treatment, as clinically indicated and appropriate.  Patient/parent/caregiver has been advised to return to the [facility] or PCP in 3-5 days if no better; to PCP or the Emergency Department if new signs and symptoms develop, or if the current signs or symptoms continue to change or worsen for further workup, evaluation and treatment as clinically indicated and appropriate  The patient will follow up with their current PCP if and as advised. If the patient does not currently have a PCP we will assist them in obtaining one.   The patient may need specialty follow up if the symptoms continue, in spite of conservative treatment and management, for further workup, evaluation, consultation and treatment as clinically indicated and appropriate.  Patient/parent/caregiver verbalized understanding and agreement of plan as discussed.  All questions were addressed during visit.  Please see discharge instructions below for further details of plan.  Discharge Instructions:   Discharge Instructions      The urinalysis that we performed today was abnormal.   Urine  culture will be performed per our protocol.  The results of urine culture should be available in the next 3 to 5 days and will be posted to your MyChart account.  If there are any abnormal results, you will be contacted by phone and if further treatment is needed, this will be provided for you.  If you were advised to begin antibiotics today, it is because you are having active symptoms of a urinary tract infection.  It is important that you complete the course of antibiotics as prescribed despite the results of your urine culture.    The most common reason for a negative urine culture, despite having active symptoms of urinary tract infection, is that a truly reliable urine sample can only be obtained with the first urination of the day.  That early morning urine sample is very concentrated so it is most likely to have a significant amount of bacteria, enough to provide a positive urine culture.  Throughout the day, as you drink fluids and consume foods, your urine becomes more diluted.  Diluted urine decreases the likelihood of a negative urine culture.  Therefore, if you begin to have significant relief of your symptoms when you begin the antibiotics but receive a phone call stating that your urine culture was negative, please trust the improvement of your symptoms as a sign that the antibiotics have been effective and that you should complete the entire course.  For your chronic idiopathic constipation, I recommend that you begin psyllium capsules, 1 capsule twice daily.  I recommend that you look for a capsule that is at least 400 mg/caps.  I provided you with a prescription in the small hope that your insurance will cover it but if they do not, at least you know what to look for on the shelf at your pharmacy.  I have included the name of Dr. Dorien Chihuahua at [place].  Please feel free to reach out to her for consultation regarding your medical history and recurrent  urinary tract infections.  Thank you for visiting urgent care today.  I hope you feel better soon.    This office note has been dictated using Museum/gallery curator.  Unfortunately, and despite my best efforts, this method of dictation can sometimes lead to occasional typographical or grammatical errors.  I apologize in advance if this occurs.      Lynden Oxford Scales, PA-C 10/04/21 [phone]

## 2021-10-04 NOTE — ED Triage Notes (Signed)
Pt reports UTI and urgency.

## 2021-10-06 LAB — URINE CULTURE: Culture: >=100000 — AB

## 2021-10-18 ENCOUNTER — Other Ambulatory Visit: Payer: Self-pay

## 2021-10-18 ENCOUNTER — Other Ambulatory Visit (INDEPENDENT_AMBULATORY_CARE_PROVIDER_SITE_OTHER): Payer: Medicare Other

## 2021-10-18 DIAGNOSIS — E039 Hypothyroidism, unspecified: Secondary | ICD-10-CM

## 2021-10-18 DIAGNOSIS — E7800 Pure hypercholesterolemia, unspecified: Secondary | ICD-10-CM | POA: Diagnosis not present

## 2021-10-18 LAB — LIPID PANEL
Cholesterol: 158 mg/dL (ref 0–200)
HDL: 40.2 mg/dL (ref >39.00)
LDL Cholesterol: 83 mg/dL (ref 0–99)
NonHDL: 117.45
Total CHOL/HDL Ratio: 4
Triglycerides: 174 mg/dL — ABNORMAL HIGH (ref 0.0–149.0)
VLDL: 34.8 mg/dL (ref 0.0–40.0)

## 2021-10-18 LAB — T3, FREE: T3, Free: 3.7 pg/mL (ref 2.3–4.2)

## 2021-10-18 LAB — TSH: TSH: 2.24 u[IU]/mL (ref 0.35–5.50)

## 2021-10-18 LAB — T4, FREE: Free T4: 0.94 ng/dL (ref 0.60–1.60)

## 2021-10-21 ENCOUNTER — Encounter: Payer: Self-pay | Admitting: Nurse Practitioner

## 2021-10-21 MED ORDER — LEVOTHYROXINE SODIUM 50 MCG PO TABS: ORAL_TABLET | ORAL | 1 refills | Status: DC

## 2021-10-21 MED ORDER — LEVOTHYROXINE SODIUM 75 MCG PO TABS: ORAL_TABLET | ORAL | 1 refills | Status: DC

## 2021-10-21 NOTE — Addendum Note (Signed)
Addended by: Leana Gamer on: 10/21/2021 03:35 PM   Modules accepted: Orders

## 2022-01-21 ENCOUNTER — Emergency Department (HOSPITAL_COMMUNITY): Payer: Medicare Other

## 2022-01-21 ENCOUNTER — Other Ambulatory Visit: Payer: Self-pay

## 2022-01-21 ENCOUNTER — Emergency Department (HOSPITAL_COMMUNITY)
Admission: EM | Admit: 2022-01-21 | Discharge: 2022-01-21 | Disposition: A | Discharge status: Home / Self Care | Payer: Medicare Other | Attending: Emergency Medicine | Admitting: Emergency Medicine

## 2022-01-21 DIAGNOSIS — M79602 Pain in left arm: Secondary | ICD-10-CM | POA: Diagnosis present

## 2022-01-21 DIAGNOSIS — R42 Dizziness and giddiness: Secondary | ICD-10-CM | POA: Insufficient documentation

## 2022-01-21 DIAGNOSIS — R200 Anesthesia of skin: Secondary | ICD-10-CM | POA: Diagnosis not present

## 2022-01-21 DIAGNOSIS — Z79890 Hormone replacement therapy (postmenopausal): Secondary | ICD-10-CM | POA: Insufficient documentation

## 2022-01-21 LAB — URINALYSIS, ROUTINE W REFLEX MICROSCOPIC
Bacteria, UA: NONE SEEN
Bilirubin Urine: NEGATIVE
Glucose, UA: NEGATIVE mg/dL
Hgb urine dipstick: NEGATIVE
Ketones, ur: NEGATIVE mg/dL
Nitrite: NEGATIVE
Protein, ur: NEGATIVE mg/dL
Specific Gravity, Urine: 1.005 (ref 1.005–1.030)
pH: 6 (ref 5.0–8.0)

## 2022-01-21 LAB — COMPREHENSIVE METABOLIC PANEL
ALT: 14 U/L (ref 0–44)
AST: 20 U/L (ref 15–41)
Albumin: 4.3 g/dL (ref 3.5–5.0)
Alkaline Phosphatase: 51 U/L (ref 38–126)
Anion gap: 8 (ref 5–15)
BUN: 25 mg/dL — ABNORMAL HIGH (ref 8–23)
CO2: 28 mmol/L (ref 22–32)
Calcium: 9.5 mg/dL (ref 8.9–10.3)
Chloride: 103 mmol/L (ref 98–111)
Creatinine, Ser: 0.93 mg/dL (ref 0.44–1.00)
GFR, Estimated: >60 mL/min (ref >60)
Glucose, Bld: 98 mg/dL (ref 70–99)
Potassium: 4 mmol/L (ref 3.5–5.1)
Sodium: 139 mmol/L (ref 135–145)
Total Bilirubin: 0.5 mg/dL (ref 0.3–1.2)
Total Protein: 7.7 g/dL (ref 6.5–8.1)

## 2022-01-21 LAB — DIFFERENTIAL
Abs Immature Granulocytes: 0.01 10*3/uL (ref 0.00–0.07)
Basophils Absolute: 0.1 10*3/uL (ref 0.0–0.1)
Basophils Relative: 1 %
Eosinophils Absolute: 0.1 10*3/uL (ref 0.0–0.5)
Eosinophils Relative: 2 %
Immature Granulocytes: 0 %
Lymphocytes Relative: 37 %
Lymphs Abs: 2.8 10*3/uL (ref 0.7–4.0)
Monocytes Absolute: 0.6 10*3/uL (ref 0.1–1.0)
Monocytes Relative: 7 %
Neutro Abs: 4.1 10*3/uL (ref 1.7–7.7)
Neutrophils Relative %: 53 %

## 2022-01-21 LAB — I-STAT CHEM 8, ED
BUN: 22 mg/dL (ref 8–23)
Calcium, Ion: 1.21 mmol/L (ref 1.15–1.40)
Chloride: 104 mmol/L (ref 98–111)
Creatinine, Ser: 0.8 mg/dL (ref 0.44–1.00)
Glucose, Bld: 89 mg/dL (ref 70–99)
HCT: 41 % (ref 36.0–46.0)
Hemoglobin: 13.9 g/dL (ref 12.0–15.0)
Potassium: 3.8 mmol/L (ref 3.5–5.1)
Sodium: 142 mmol/L (ref 135–145)
TCO2: 28 mmol/L (ref 22–32)

## 2022-01-21 LAB — APTT: aPTT: 27 seconds (ref 24–36)

## 2022-01-21 LAB — CBC
HCT: 43.9 % (ref 36.0–46.0)
Hemoglobin: 15.1 g/dL — ABNORMAL HIGH (ref 12.0–15.0)
MCH: 32.8 pg (ref 26.0–34.0)
MCHC: 34.4 g/dL (ref 30.0–36.0)
MCV: 95.4 fL (ref 80.0–100.0)
Platelets: 308 10*3/uL (ref 150–400)
RBC: 4.6 MIL/uL (ref 3.87–5.11)
RDW: 12.4 % (ref 11.5–15.5)
WBC: 7.6 10*3/uL (ref 4.0–10.5)
nRBC: 0 % (ref 0.0–0.2)

## 2022-01-21 LAB — ETHANOL: Alcohol, Ethyl (B): <10 mg/dL (ref <10)

## 2022-01-21 LAB — RAPID URINE DRUG SCREEN, HOSP PERFORMED
Amphetamines: NOT DETECTED
Barbiturates: NOT DETECTED
Benzodiazepines: NOT DETECTED
Cocaine: NOT DETECTED
Opiates: NOT DETECTED
Tetrahydrocannabinol: NOT DETECTED

## 2022-01-21 LAB — PROTIME-INR
INR: 1 (ref 0.8–1.2)
Prothrombin Time: 13.2 seconds (ref 11.4–15.2)

## 2022-01-21 LAB — TROPONIN I (HIGH SENSITIVITY)
Troponin I (High Sensitivity): 2 ng/L (ref <18)
Troponin I (High Sensitivity): 2 ng/L (ref <18)

## 2022-01-21 NOTE — ED Notes (Signed)
Patient stated understanding of discharge paperwork and that she had no further questions. Patient ambulated out of ED. ?

## 2022-01-21 NOTE — ED Provider Notes (Addendum)
?[facility] ?Provider Note ? ? ?CSN: [id number] ?Arrival date & time: 01/21/22  1402 ? ?  ? ?History ? ?Chief Complaint  ?Patient presents with  ? Arm Pain  ? finger tingling   ? ? ?Dawn Norris is a 76 y.o. female. ? ?Patient with presentation of atypical symptoms that started about an hour prior to arrival.  Patient was washing dishes got pain in the left arm and left middle finger started to feel numb and then turning blue but no pain in the fingers.  Patient is not clear whether moving the left arm increased the pain.  It started to happen on the right middle finger but did not progress as much.  Everything resolved upon arrival here.  Coming back to the room walking she felt lightheaded and dizzy but denied any room spinning.  Denies any weakness in her arms or legs or any visual changes or any speech problems patient was at home by herself.  Patient is never had anything like this before had no anterior chest pain no shortness of breath no nausea no vomiting past medical history is significant for thyroid disease.  No known coronary artery disease or heart problems.  Non-smoker.  No history of stroke.  Patient is on Synthroid.  Patient is followed by Pietro Cassis.  Patient is not on any blood thinners.  Patient's husband is out of town and patient also received some bad news about the death of a friend today. ? ? ?  ? ?Home Medications ?Prior to Admission medications   ?Medication Sig Start Date End Date Taking? Authorizing Provider  ?ibuprofen (ADVIL) 200 MG tablet Take 200 mg by mouth 3 (three) times daily as needed (pain).   Yes [provider]  ?levothyroxine (SYNTHROID) 50 MCG tablet Takes on Tues, Thurs, Sat, Sun ?Patient taking differently: Take 50 mcg by mouth See admin instructions. Take one tablet (50 mcg) by mouth on Sunday, Tuesday, Thursday, Saturday - before breakfast (take 75 mcg on other days) 10/21/21  Yes Nche, Charlene Brooke, NP  ?levothyroxine  (SYNTHROID) 75 MCG tablet Takes on MWF ?Patient taking differently: Take 75 mcg by mouth See admin instructions. Take one tablet (75 mcg) by mouth every Monday, Wednesday, Friday - before breakfast (take 50 mcg on other days) 10/21/21  Yes Nche, Charlene Brooke, NP  ?Multiple Vitamin (MULTIVITAMIN WITH MINERALS) TABS tablet Take 1 tablet by mouth every morning.   Yes [provider]  ?Ospemifene (OSPHENA) 60 MG TABS Take 60 mg by mouth daily with breakfast.   Yes [provider]  ?sulfamethoxazole-trimethoprim (BACTRIM) 400-80 MG tablet Take 1 tablet by mouth daily as needed (after intercourse). ?Patient not taking: Reported on 01/21/2022 12/27/21   [provider]  ?   ? ?Allergies    ?Codeine, Epinephrine, Erythromycin, and Morphine   ? ?Review of Systems   ?Review of Systems  ?Constitutional:  Negative for chills and fever.  ?HENT:  Negative for ear pain and sore throat.   ?Eyes:  Negative for pain and visual disturbance.  ?Respiratory:  Negative for cough and shortness of breath.   ?Cardiovascular:  Negative for chest pain and palpitations.  ?Gastrointestinal:  Negative for abdominal pain and vomiting.  ?Genitourinary:  Negative for dysuria and hematuria.  ?Musculoskeletal:  Negative for arthralgias and back pain.  ?Skin:  Negative for color change and rash.  ?Neurological:  Positive for dizziness, light-headedness and numbness. Negative for tremors, seizures, syncope, speech difficulty and weakness.  ?Hematological:  Does  not bruise/bleed easily.  ?All other systems reviewed and are negative. ? ?Physical Exam ?Updated Vital Signs ?BP (!) 118/91   Pulse 69   Temp 97.8 ?F (36.6 ?C) (Oral)   Resp 19   Ht 1.651 m ('5\' 5"'$ )   Wt 63 kg   SpO2 99%   BMI 23.11 kg/m?  ?Physical Exam ?Vitals and nursing note reviewed.  ?Constitutional:   ?   General: She is not in acute distress. ?   Appearance: Normal appearance. She is well-developed.  ?HENT:  ?   Head: Normocephalic and atraumatic.  ?Eyes:  ?    Extraocular Movements: Extraocular movements intact.  ?   Conjunctiva/sclera: Conjunctivae normal.  ?   Pupils: Pupils are equal, round, and reactive to light.  ?Cardiovascular:  ?   Rate and Rhythm: Normal rate and regular rhythm.  ?   Heart sounds: No murmur heard. ?Pulmonary:  ?   Effort: Pulmonary effort is normal. No respiratory distress.  ?   Breath sounds: Normal breath sounds.  ?Abdominal:  ?   Palpations: Abdomen is soft.  ?   Tenderness: There is no abdominal tenderness.  ?Musculoskeletal:     ?   General: No swelling or tenderness.  ?   Cervical back: Normal range of motion and neck supple.  ?   Comments: Dorsalis pedis pulses 2+ bilaterally no leg swelling.  Radial pulses are 2+ bilaterally.  Good cap refill to all fingers.  Good movement of fingers hands wrist elbow and shoulders on the upper extremities.  No weakness to lower extremities.  ?Skin: ?   General: Skin is warm and dry.  ?   Capillary Refill: Capillary refill takes less than 2 seconds.  ?Neurological:  ?   General: No focal deficit present.  ?   Mental Status: She is alert and oriented to person, place, and time.  ?   Cranial Nerves: No cranial nerve deficit.  ?   Sensory: No sensory deficit.  ?   Motor: No weakness.  ?   Comments: Currently no focal weakness at all.  ?Psychiatric:     ?   Mood and Affect: Mood normal.  ? ? ?ED Results / Procedures / Treatments   ?Labs ?(all labs ordered are listed, but only abnormal results are displayed) ?Labs Reviewed  ?CBC - Abnormal; Notable for the following components:  ?    Result Value  ? Hemoglobin 15.1 (*)   ? All other components within normal limits  ?COMPREHENSIVE METABOLIC PANEL - Abnormal; Notable for the following components:  ? BUN 25 (*)   ? All other components within normal limits  ?URINALYSIS, ROUTINE W REFLEX MICROSCOPIC - Abnormal; Notable for the following components:  ? Color, Urine STRAW (*)   ? Leukocytes,Ua TRACE (*)   ? All other components within normal limits  ?ETHANOL   ?PROTIME-INR  ?APTT  ?DIFFERENTIAL  ?RAPID URINE DRUG SCREEN, HOSP PERFORMED  ?I-STAT CHEM 8, ED  ?TROPONIN I (HIGH SENSITIVITY)  ? ? ?EKG ?EKG Interpretation ? ?Date/Time:  Saturday January 21 2022 15:07:18 EDT ?Ventricular Rate:  63 ?PR Interval:  145 ?QRS Duration: 103 ?QT Interval:  396 ?QTC Calculation: 406 ?R Axis:   26 ?Text Interpretation: Sinus rhythm Multiple ventricular premature complexes Low voltage, precordial leads No previous ECGs available Confirmed by Fredia Sorrow [id number]) on 01/21/2022 3:16:33 PM ? ?Radiology ?CT HEAD WO CONTRAST ? ?Result Date: 01/21/2022 ?CLINICAL DATA:  Extremity numbness. Neuro deficit. Stroke suspected. EXAM: CT HEAD WITHOUT CONTRAST TECHNIQUE: Contiguous axial images  were obtained from the base of the skull through the vertex without intravenous contrast. RADIATION DOSE REDUCTION: This exam was performed according to the departmental dose-optimization program which includes automated exposure control, adjustment of the mA and/or kV according to patient size and/or use of iterative reconstruction technique. COMPARISON:  No comparison studies available. FINDINGS: Brain: There is no evidence for acute hemorrhage, hydrocephalus, mass lesion, or abnormal extra-axial fluid collection. No definite CT evidence for acute infarction. Vascular: No hyperdense vessel or unexpected calcification. Skull: No evidence for fracture. No worrisome lytic or sclerotic lesion. Sinuses/Orbits: The visualized paranasal sinuses and mastoid air cells are clear. Visualized portions of the globes and intraorbital fat are unremarkable. Other: None. IMPRESSION: No acute intracranial abnormality. Electronically Signed   By: Misty Stanley M.D.   On: 01/21/2022 15:23  ? ?DG Chest Port 1 View ? ?Result Date: 01/21/2022 ?CLINICAL DATA:  chest pain EXAM: PORTABLE CHEST 1 VIEW COMPARISON:  None. FINDINGS: The cardiomediastinal silhouette is normal in contour. No pleural effusion. No pneumothorax. No acute  pleuroparenchymal abnormality. Visualized abdomen is unremarkable. No acute osseous abnormality noted. IMPRESSION: No acute cardiopulmonary abnormality. Electronically Signed   By: Valentino Saxon M.D.   On: 01/21/2022

## 2022-01-21 NOTE — Discharge Instructions (Signed)
Work-up from a cardiac standpoint without any acute findings.  Would recommend make an appointment follow-up with neurology.  Obviously return for any new or worse symptoms.  Also make an appointment to follow-up with your regular doctor. ?

## 2022-01-21 NOTE — ED Triage Notes (Signed)
Patient reports she was washing dishes and her L middle finger started turning blue, started having L arm pain and then R middle finger discoloration. Which has mostly resolved. Got dizzy walking back to an ED room, reports this is abnormal.  ?

## 2022-01-23 ENCOUNTER — Encounter: Payer: Self-pay | Admitting: Neurology

## 2022-01-26 ENCOUNTER — Encounter: Payer: Self-pay | Admitting: Nurse Practitioner

## 2022-01-26 ENCOUNTER — Ambulatory Visit (INDEPENDENT_AMBULATORY_CARE_PROVIDER_SITE_OTHER): Payer: Medicare Other | Admitting: Nurse Practitioner

## 2022-01-26 ENCOUNTER — Ambulatory Visit (INDEPENDENT_AMBULATORY_CARE_PROVIDER_SITE_OTHER): Payer: Medicare Other

## 2022-01-26 ENCOUNTER — Ambulatory Visit: Payer: Medicare Other

## 2022-01-26 VITALS — BP 128/72 | HR 60 | Temp 97.3°F | Ht 65.0 in | Wt 138.5 lb

## 2022-01-26 DIAGNOSIS — M79622 Pain in left upper arm: Secondary | ICD-10-CM | POA: Diagnosis not present

## 2022-01-26 DIAGNOSIS — R202 Paresthesia of skin: Secondary | ICD-10-CM

## 2022-01-26 DIAGNOSIS — Z8673 Personal history of transient ischemic attack (TIA), and cerebral infarction without residual deficits: Secondary | ICD-10-CM

## 2022-01-26 DIAGNOSIS — M5442 Lumbago with sciatica, left side: Secondary | ICD-10-CM

## 2022-01-26 DIAGNOSIS — G8929 Other chronic pain: Secondary | ICD-10-CM

## 2022-01-26 HISTORY — DX: Personal history of transient ischemic attack (TIA), and cerebral infarction without residual deficits: Z86.73

## 2022-01-26 NOTE — Assessment & Plan Note (Signed)
Persistent back pain with radiculopathy to left leg. ?Worse in supine position and laying on left side. ?No weakness, no numbness or tingling of left leg. ? ?Agreed to go for lumbar spine x-ray. ?Continue with use of tylenol prn ?

## 2022-01-26 NOTE — Patient Instructions (Addendum)
Go to lab for back and neck x-ray ?You will be contacted to schedule appt for MRI ?Maintain upcoming appt with neurology. ?Start aspirin '81mg'$  EC daily till MRI is completed. ?

## 2022-01-26 NOTE — Assessment & Plan Note (Addendum)
Acute on chronic, not affected by movement pr palpation. No neck or shoulder pain. ? ?Normal exam ?Get DG cervical spine ?

## 2022-01-26 NOTE — Assessment & Plan Note (Addendum)
ED visit on 01/21/22 due to sudden onset of dizziness, left arm pain, cyanosis of fingers and numbness in bilateral fingers. ?Reviewed ED notes, lab results and radiology report: no acute finding with CT head. She declined MRI brain during ED visit. She has upcoming appt neurology 02/15/2022. ?No tobacco use, no ETOH use, no HTN or CVA ?BP Readings from Last 3 Encounters:  ?01/26/22 128/72  ?01/21/22 110/66  ?10/04/21 121/65  ? ?Lipid Panel  ?   ?Component Value Date/Time  ? CHOL 158 10/18/2021 0850  ? TRIG 174.0 (H) 10/18/2021 0850  ? HDL 40.20 10/18/2021 0850  ? CHOLHDL 4 10/18/2021 0850  ? VLDL 34.8 10/18/2021 0850  ? [place] 83 10/18/2021 0850  ? ?Today she reports numbness and dizziness has resolved, but persistent left arm pain. No weakness. ?We discussed her risk for CVA. She agreed to proceed with MRI brain, start aspirin '81mg'$  EC and maintain upcoming appt with neurology. ?

## 2022-01-26 NOTE — Progress Notes (Signed)
? ?             Established Patient Visit ? ?Patient: Dawn Norris   DOB: Jul 24, 1946   76 y.o. Female  MRN: [id number] ?Visit Date: 01/26/2022 ? ?Subjective:  ?  ?Chief Complaint  ?Patient presents with  ? Hospitalization Follow-up  ?  Pt was in the hospital on 01/21/22 and states she has been feeling fine since leaving the hospital. Pt states she was informed that she may need a MRI, but is unsure if she really needs it and would like to discuss with you.   ? ?HPI ?Chronic bilateral low back pain with left-sided sciatica ?Persistent back pain with radiculopathy to left leg. ?Worse in supine position and laying on left side. ?No weakness, no numbness or tingling of left leg. ? ?Agreed to go for lumbar spine x-ray. ?Continue with use of tylenol prn ? ?History of TIA (transient ischemic attack) ?ED visit on 01/21/22 due to sudden onset of dizziness, left arm pain, cyanosis of fingers and numbness in bilateral fingers. ?Reviewed ED notes, lab results and radiology report: no acute finding with CT head. She declined MRI brain during ED visit. She has upcoming appt neurology 02/15/2022. ?No tobacco use, no ETOH use, no HTN or CVA ?BP Readings from Last 3 Encounters:  ?01/26/22 128/72  ?01/21/22 110/66  ?10/04/21 121/65  ? ?Lipid Panel  ?   ?Component Value Date/Time  ? CHOL 158 10/18/2021 0850  ? TRIG 174.0 (H) 10/18/2021 0850  ? HDL 40.20 10/18/2021 0850  ? CHOLHDL 4 10/18/2021 0850  ? VLDL 34.8 10/18/2021 0850  ? [place] 83 10/18/2021 0850  ? ?Today she reports numbness and dizziness has resolved, but persistent left arm pain. No weakness. ?We discussed her risk for CVA. She agreed to proceed with MRI brain, start aspirin '81mg'$  EC and maintain upcoming appt with neurology. ? ?Left upper arm pain ?Acute on chronic, not affteted by movement ? ? ?Reviewed medical, surgical, and social history today ? ?Medications: ?Outpatient Medications Prior to Visit  ?Medication Sig  ? ibuprofen (ADVIL) 200 MG tablet Take 200 mg by mouth 3  (three) times daily as needed (pain).  ? levothyroxine (SYNTHROID) 50 MCG tablet Takes on Tues, Thurs, Sat, Sun (Patient taking differently: Take 50 mcg by mouth See admin instructions. Take one tablet (50 mcg) by mouth on Sunday, Tuesday, Thursday, Saturday - before breakfast (take 75 mcg on other days))  ? levothyroxine (SYNTHROID) 75 MCG tablet Takes on MWF (Patient taking differently: Take 75 mcg by mouth See admin instructions. Take one tablet (75 mcg) by mouth every Monday, Wednesday, Friday - before breakfast (take 50 mcg on other days))  ? Multiple Vitamin (MULTIVITAMIN WITH MINERALS) TABS tablet Take 1 tablet by mouth every morning.  ? Ospemifene (OSPHENA) 60 MG TABS Take 60 mg by mouth daily with breakfast.  ? [DISCONTINUED] sulfamethoxazole-trimethoprim (BACTRIM) 400-80 MG tablet Take 1 tablet by mouth daily as needed (after intercourse). (Patient not taking: Reported on 01/26/2022)  ? ?No facility-administered medications prior to visit.  ? ?Reviewed past medical and social history.  ? ?ROS per HPI above ? ? ?   ?Objective:  ?BP 128/72 (BP Location: Left Arm, Patient Position: Sitting, Cuff Size: Normal)   Pulse 60   Temp (!) 97.3 ?F (36.3 ?C) (Temporal)   Ht '5\' 5"'$  (1.651 m)   Wt 138 lb 8 oz (62.8 kg)   SpO2 97%   BMI 23.05 kg/m?  ? ?  ? ?Physical Exam ?Eyes:  ?  Extraocular Movements: Extraocular movements intact.  ?   Conjunctiva/sclera: Conjunctivae normal.  ?   Pupils: Pupils are equal, round, and reactive to light.  ?Cardiovascular:  ?   Rate and Rhythm: Normal rate.  ?   Pulses: Normal pulses.  ?Pulmonary:  ?   Effort: Pulmonary effort is normal.  ?Musculoskeletal:  ?   Cervical back: Normal, normal range of motion and neck supple. No spinous process tenderness or muscular tenderness.  ?   Thoracic back: Normal.  ?   Lumbar back: Normal.  ?   Right hip: Tenderness present. No crepitus. Normal range of motion. Normal strength.  ?   Left hip: Tenderness present. No crepitus. Normal range of  motion. Normal strength.  ?   Right upper leg: Normal.  ?   Left upper leg: Normal.  ?Neurological:  ?   Mental Status: She is alert and oriented to person, place, and time.  ?   Cranial Nerves: No cranial nerve deficit.  ?   Sensory: No sensory deficit.  ?   Motor: No weakness.  ?   Coordination: Coordination normal.  ?   Gait: Gait is intact.  ?Psychiatric:     ?   Mood and Affect: Mood normal.     ?   Behavior: Behavior normal.     ?   Thought Content: Thought content normal.  ?  ?No results found for any visits on 01/26/22. ?   ?Assessment & Plan:  ?  ?Problem List Items Addressed This Visit   ? ?  ? Nervous and Auditory  ? Chronic bilateral low back pain with left-sided sciatica - Primary  ?  Persistent back pain with radiculopathy to left leg. ?Worse in supine position and laying on left side. ?No weakness, no numbness or tingling of left leg. ? ?Agreed to go for lumbar spine x-ray. ?Continue with use of tylenol prn ?  ?  ? Relevant Orders  ? DG Lumbar Spine Complete  ?  ? Other  ? History of TIA (transient ischemic attack)  ?  ED visit on 01/21/22 due to sudden onset of dizziness, left arm pain, cyanosis of fingers and numbness in bilateral fingers. ?Reviewed ED notes, lab results and radiology report: no acute finding with CT head. She declined MRI brain during ED visit. She has upcoming appt neurology 02/15/2022. ?No tobacco use, no ETOH use, no HTN or CVA ?BP Readings from Last 3 Encounters:  ?01/26/22 128/72  ?01/21/22 110/66  ?10/04/21 121/65  ? ?Lipid Panel  ?   ?Component Value Date/Time  ? CHOL 158 10/18/2021 0850  ? TRIG 174.0 (H) 10/18/2021 0850  ? HDL 40.20 10/18/2021 0850  ? CHOLHDL 4 10/18/2021 0850  ? VLDL 34.8 10/18/2021 0850  ? [place] 83 10/18/2021 0850  ? ?Today she reports numbness and dizziness has resolved, but persistent left arm pain. No weakness. ?We discussed her risk for CVA. She agreed to proceed with MRI brain, start aspirin '81mg'$  EC and maintain upcoming appt with neurology. ?  ?  ?  Relevant Orders  ? MR Brain W Wo Contrast  ? Left upper arm pain  ?  Acute on chronic, not affteted by movement ?  ?  ? Relevant Orders  ? DG Cervical Spine Complete  ? ?Other Visit Diagnoses   ? ? Paresthesia of finger      ? Relevant Orders  ? DG Cervical Spine Complete  ? ?  ? ?Return if symptoms worsen or fail to improve. ? ?  ? ?  [name], NP ? ? ?

## 2022-01-27 ENCOUNTER — Encounter: Payer: Self-pay | Admitting: Nurse Practitioner

## 2022-01-27 ENCOUNTER — Ambulatory Visit (INDEPENDENT_AMBULATORY_CARE_PROVIDER_SITE_OTHER): Payer: Medicare Other

## 2022-01-27 DIAGNOSIS — M5442 Lumbago with sciatica, left side: Secondary | ICD-10-CM

## 2022-01-27 DIAGNOSIS — G8929 Other chronic pain: Secondary | ICD-10-CM

## 2022-01-27 DIAGNOSIS — Z8659 Personal history of other mental and behavioral disorders: Secondary | ICD-10-CM

## 2022-01-27 MED ORDER — LORAZEPAM 0.5 MG PO TABS: 0.5000 mg | ORAL_TABLET | Freq: Two times a day (BID) | ORAL | 0 refills | Status: DC | PRN

## 2022-01-31 ENCOUNTER — Encounter: Payer: Self-pay | Admitting: Nurse Practitioner

## 2022-01-31 DIAGNOSIS — G8929 Other chronic pain: Secondary | ICD-10-CM

## 2022-01-31 DIAGNOSIS — M5030 Other cervical disc degeneration, unspecified cervical region: Secondary | ICD-10-CM

## 2022-01-31 DIAGNOSIS — M5412 Radiculopathy, cervical region: Secondary | ICD-10-CM

## 2022-02-06 ENCOUNTER — Other Ambulatory Visit: Payer: Self-pay | Admitting: Nurse Practitioner

## 2022-02-06 ENCOUNTER — Ambulatory Visit
Admission: RE | Admit: 2022-02-06 | Discharge: 2022-02-06 | Disposition: A | Discharge status: Home / Self Care | Payer: Medicare Other | Source: Ambulatory Visit | Attending: Nurse Practitioner | Admitting: Nurse Practitioner

## 2022-02-06 ENCOUNTER — Encounter: Payer: Self-pay | Admitting: Nurse Practitioner

## 2022-02-06 DIAGNOSIS — Z8673 Personal history of transient ischemic attack (TIA), and cerebral infarction without residual deficits: Secondary | ICD-10-CM

## 2022-02-15 ENCOUNTER — Ambulatory Visit: Payer: Medicare Other | Admitting: Neurology

## 2022-02-17 ENCOUNTER — Other Ambulatory Visit: Payer: Self-pay | Admitting: Neurological Surgery

## 2022-02-17 DIAGNOSIS — M5416 Radiculopathy, lumbar region: Secondary | ICD-10-CM

## 2022-03-08 ENCOUNTER — Ambulatory Visit
Admission: RE | Admit: 2022-03-08 | Discharge: 2022-03-08 | Disposition: A | Discharge status: Home / Self Care | Payer: Medicare Other | Source: Ambulatory Visit | Attending: Neurological Surgery | Admitting: Neurological Surgery

## 2022-03-08 DIAGNOSIS — M5416 Radiculopathy, lumbar region: Secondary | ICD-10-CM

## 2022-04-12 ENCOUNTER — Other Ambulatory Visit: Payer: Self-pay | Admitting: Nurse Practitioner

## 2022-04-12 DIAGNOSIS — E039 Hypothyroidism, unspecified: Secondary | ICD-10-CM

## 2022-04-12 NOTE — Telephone Encounter (Signed)
Chart supports Rx Last OV: 01/2022 Next OV not scheduled  Needs appt for further refills  Please verify sig.Marland Kitchen

## 2022-06-01 ENCOUNTER — Encounter: Payer: Self-pay | Admitting: Nurse Practitioner

## 2022-06-01 ENCOUNTER — Ambulatory Visit (INDEPENDENT_AMBULATORY_CARE_PROVIDER_SITE_OTHER): Payer: Medicare Other | Admitting: Nurse Practitioner

## 2022-06-01 VITALS — BP 112/60 | HR 63 | Temp 96.9°F | Ht 65.0 in | Wt 132.0 lb

## 2022-06-01 DIAGNOSIS — R42 Dizziness and giddiness: Secondary | ICD-10-CM | POA: Diagnosis not present

## 2022-06-01 DIAGNOSIS — D2271 Melanocytic nevi of right lower limb, including hip: Secondary | ICD-10-CM | POA: Insufficient documentation

## 2022-06-01 DIAGNOSIS — Z1211 Encounter for screening for malignant neoplasm of colon: Secondary | ICD-10-CM

## 2022-06-01 DIAGNOSIS — K921 Melena: Secondary | ICD-10-CM | POA: Insufficient documentation

## 2022-06-01 DIAGNOSIS — D225 Melanocytic nevi of trunk: Secondary | ICD-10-CM | POA: Insufficient documentation

## 2022-06-01 LAB — CBC WITH DIFFERENTIAL/PLATELET
Basophils Absolute: 0 10*3/uL (ref 0.0–0.1)
Basophils Relative: 0.5 % (ref 0.0–3.0)
Eosinophils Absolute: 0.1 10*3/uL (ref 0.0–0.7)
Eosinophils Relative: 0.7 % (ref 0.0–5.0)
HCT: 42.5 % (ref 36.0–46.0)
Hemoglobin: 14.1 g/dL (ref 12.0–15.0)
Lymphocytes Relative: 29.1 % (ref 12.0–46.0)
Lymphs Abs: 2.5 10*3/uL (ref 0.7–4.0)
MCHC: 33.1 g/dL (ref 30.0–36.0)
MCV: 97.4 fl (ref 78.0–100.0)
Monocytes Absolute: 0.6 10*3/uL (ref 0.1–1.0)
Monocytes Relative: 7.3 % (ref 3.0–12.0)
Neutro Abs: 5.4 10*3/uL (ref 1.4–7.7)
Neutrophils Relative %: 62.4 % (ref 43.0–77.0)
Platelets: 283 10*3/uL (ref 150.0–400.0)
RBC: 4.37 Mil/uL (ref 3.87–5.11)
RDW: 13.5 % (ref 11.5–15.5)
WBC: 8.7 10*3/uL (ref 4.0–10.5)

## 2022-06-01 NOTE — Assessment & Plan Note (Signed)
Normal MRI brain 01/2022: no evidence of atherosclerosis or previous infarct or lesion or atrophy.

## 2022-06-01 NOTE — Assessment & Plan Note (Addendum)
Onset after eating crab sandwich, she developed black tarry loose stool. Associated with ABD bloating. This eventually resolved. 1week later after eating ice cream, she developed diarrhea again No fever, no nausea, no emesis Diarrhea has resolved and has last normal BM on 05/28/2022 Today she reports Normal appetite and no postpradial ABD pain/cramping. She has have persistent ABD bloating. She completed Cefdinir for UTI x 6days (07/29-08/04). Last colonoscopy at age 63, normal, No diverticulosis per patient Reports Hx of GI bleed due to an ulcer in colon per patient 2yr ago.  Check stool for E. Coli or salmenella or c.diff C.diif collected only if persistent diarrhea Check cbc: normal agreed to repeat colonoscopy

## 2022-06-01 NOTE — Patient Instructions (Signed)
Go to lab Return stool kit to lab as sooner as possible. Start probiotic: florastor or curturelle or align 1cap daily. If no BM in 1week, start miralax 17g daily.

## 2022-06-01 NOTE — Progress Notes (Signed)
Established Patient Visit  Patient: Dawn Norris   DOB: 12/26/1945   76 y.o. Female  MRN: [id number] Visit Date: 06/03/2022  Subjective:    Chief Complaint  Patient presents with   Acute Visit    C/o black "tarry" diarrhea two weeks ago , denies any cramping or pain  Had spotting a month ago     HPI Black tarry stools Onset after eating crab sandwich, she developed black tarry loose stool. Associated with ABD bloating. This eventually resolved. 1week later after eating ice cream, she developed diarrhea again No fever, no nausea, no emesis Diarrhea has resolved and has last normal BM on 05/28/2022 Today she reports Normal appetite and no postpradial ABD pain/cramping. She has have persistent ABD bloating. She completed Cefdinir for UTI x 6days (07/29-08/04). Last colonoscopy at age 75, normal, No diverticulosis per patient Reports Hx of GI bleed due to an ulcer in colon per patient 71yr ago.  Check stool for E. Coli or salmenella or c.diff C.diif collected only if persistent diarrhea Check cbc: normal agreed to repeat colonoscopy  History of TIA (transient ischemic attack) Normal MRI brain 01/2022: no evidence of atherosclerosis or previous infarct or lesion or atrophy.  Reviewed medical, surgical, and social history today  Medications: Outpatient Medications Prior to Visit  Medication Sig   ibuprofen (ADVIL) 200 MG tablet Take 200 mg by mouth 3 (three) times daily as needed (pain).   levothyroxine (SYNTHROID) 50 MCG tablet TAKE 1 TABLET BY MOUTH ON TUESDAY, THURSDAY, SATURDAY AND SUNDAY. No additional refills without appt.   levothyroxine (SYNTHROID) 75 MCG tablet Takes on MWF (Patient taking differently: Take 75 mcg by mouth See admin instructions. Take one tablet (75 mcg) by mouth every Monday, Wednesday, Friday - before breakfast (take 50 mcg on other days))   medroxyPROGESTERone (PROVERA) 10 MG tablet Take 10 mg by mouth daily.   Multiple Vitamin  (MULTIVITAMIN WITH MINERALS) TABS tablet Take 1 tablet by mouth every morning.   Ospemifene (OSPHENA) 60 MG TABS Take 60 mg by mouth daily with breakfast.   [DISCONTINUED] LORazepam (ATIVAN) 0.5 MG tablet Take 1 tablet (0.5 mg total) by mouth 2 (two) times daily as needed for up to 2 doses for anxiety. Use prior to MRI scan   No facility-administered medications prior to visit.   Reviewed past medical and social history.   ROS per HPI above      Objective:  BP 112/60 (BP Location: Right Arm, Patient Position: Sitting, Cuff Size: Small)   Pulse 63   Temp (!) 96.9 F (36.1 C) (Temporal)   Ht '5\' 5"'$  (1.651 m)   Wt 132 lb (59.9 kg)   SpO2 98%   BMI 21.97 kg/m      Physical Exam Constitutional:      General: She is not in acute distress. Cardiovascular:     Rate and Rhythm: Normal rate.     Pulses: Normal pulses.  Pulmonary:     Effort: Pulmonary effort is normal.  Abdominal:     General: Bowel sounds are normal. There is no distension.     Palpations: Abdomen is soft. There is no mass.     Tenderness: There is no abdominal tenderness. There is no right CVA tenderness, left CVA tenderness or guarding.  Neurological:     Mental Status: She is alert and oriented to person, place, and time.     Results for orders placed or performed  in visit on 06/01/22  CBC with Differential/Platelet  Result Value Ref Range   WBC 8.7 4.0 - 10.5 K/uL   RBC 4.37 3.87 - 5.11 Mil/uL   Hemoglobin 14.1 12.0 - 15.0 g/dL   HCT 42.5 36.0 - 46.0 %   MCV 97.4 78.0 - 100.0 fl   MCHC 33.1 30.0 - 36.0 g/dL   RDW 13.5 11.5 - 15.5 %   Platelets 283.0 150.0 - 400.0 K/uL   Neutrophils Relative % 62.4 43.0 - 77.0 %   Lymphocytes Relative 29.1 12.0 - 46.0 %   Monocytes Relative 7.3 3.0 - 12.0 %   Eosinophils Relative 0.7 0.0 - 5.0 %   Basophils Relative 0.5 0.0 - 3.0 %   Neutro Abs 5.4 1.4 - 7.7 K/uL   Lymphs Abs 2.5 0.7 - 4.0 K/uL   Monocytes Absolute 0.6 0.1 - 1.0 K/uL   Eosinophils Absolute 0.1 0.0  - 0.7 K/uL   Basophils Absolute 0.0 0.0 - 0.1 K/uL      Assessment & Plan:    Problem List Items Addressed This Visit       Other   Black tarry stools - Primary    Onset after eating crab sandwich, she developed black tarry loose stool. Associated with ABD bloating. This eventually resolved. 1week later after eating ice cream, she developed diarrhea again No fever, no nausea, no emesis Diarrhea has resolved and has last normal BM on 05/28/2022 Today she reports Normal appetite and no postpradial ABD pain/cramping. She has have persistent ABD bloating. She completed Cefdinir for UTI x 6days (07/29-08/04). Last colonoscopy at age 71, normal, No diverticulosis per patient Reports Hx of GI bleed due to an ulcer in colon per patient 27yr ago.  Check stool for E. Coli or salmenella or c.diff C.diif collected only if persistent diarrhea Check cbc: normal agreed to repeat colonoscopy      Relevant Orders   CBC with Differential/Platelet (Completed)   Stool Culture   C. difficile GDH and Toxin A/B   RESOLVED: History of TIA (transient ischemic attack)    Normal MRI brain 01/2022: no evidence of atherosclerosis or previous infarct or lesion or atrophy.      Other Visit Diagnoses     Colon cancer screening       Relevant Orders   Ambulatory referral to Gastroenterology      Return if symptoms worsen or fail to improve.     CWilfred Lacy NP

## 2022-06-05 ENCOUNTER — Other Ambulatory Visit: Payer: Medicare Other

## 2022-06-05 DIAGNOSIS — K921 Melena: Secondary | ICD-10-CM

## 2022-06-06 LAB — C. DIFFICILE GDH AND TOXIN A/B
GDH ANTIGEN: NOT DETECTED
MICRO NUMBER:: 13807711
SPECIMEN QUALITY:: ADEQUATE
TOXIN A AND B: NOT DETECTED

## 2022-06-09 ENCOUNTER — Encounter: Payer: Self-pay | Admitting: Nurse Practitioner

## 2022-06-09 LAB — STOOL CULTURE: E coli, Shiga toxin Assay: NEGATIVE

## 2022-06-15 DIAGNOSIS — N950 Postmenopausal bleeding: Secondary | ICD-10-CM | POA: Insufficient documentation

## 2022-06-20 ENCOUNTER — Encounter: Payer: Medicare Other | Admitting: Physical Medicine & Rehabilitation

## 2022-06-28 ENCOUNTER — Other Ambulatory Visit: Payer: Self-pay | Admitting: Nurse Practitioner

## 2022-06-28 DIAGNOSIS — E039 Hypothyroidism, unspecified: Secondary | ICD-10-CM

## 2022-06-28 NOTE — Telephone Encounter (Signed)
Chart supports Rx Last OV: 05/2022 Next OV: not scheduled   

## 2022-06-29 ENCOUNTER — Encounter: Payer: Self-pay | Admitting: Nurse Practitioner

## 2022-06-29 ENCOUNTER — Other Ambulatory Visit: Payer: Self-pay | Admitting: Nurse Practitioner

## 2022-06-29 DIAGNOSIS — E039 Hypothyroidism, unspecified: Secondary | ICD-10-CM

## 2022-07-14 ENCOUNTER — Telehealth: Payer: Self-pay | Admitting: Nurse Practitioner

## 2022-07-14 NOTE — Telephone Encounter (Signed)
Labs faxed to Butch Penny at number provided

## 2022-07-14 NOTE — Telephone Encounter (Signed)
Butch Penny from [facility] is needing pt's most recent TSH lab faxed over @ [phone]. Donna[facility] # [phone].

## 2022-07-17 ENCOUNTER — Other Ambulatory Visit: Payer: Self-pay | Admitting: Nurse Practitioner

## 2022-07-17 DIAGNOSIS — E039 Hypothyroidism, unspecified: Secondary | ICD-10-CM

## 2022-07-17 NOTE — Telephone Encounter (Signed)
Chart supports Rx Last OV: 05/2022 Next OV: not scheduled   

## 2022-09-26 DIAGNOSIS — N8502 Endometrial intraepithelial neoplasia [EIN]: Secondary | ICD-10-CM | POA: Insufficient documentation

## 2022-09-28 ENCOUNTER — Ambulatory Visit: Payer: Medicare Other | Admitting: Nurse Practitioner

## 2022-10-10 ENCOUNTER — Emergency Department (HOSPITAL_BASED_OUTPATIENT_CLINIC_OR_DEPARTMENT_OTHER): Payer: Medicare Other | Admitting: Radiology

## 2022-10-10 ENCOUNTER — Emergency Department (HOSPITAL_BASED_OUTPATIENT_CLINIC_OR_DEPARTMENT_OTHER)
Admission: EM | Admit: 2022-10-10 | Discharge: 2022-10-10 | Discharge status: Against Medical Advice | Payer: Medicare Other | Attending: Emergency Medicine | Admitting: Emergency Medicine

## 2022-10-10 ENCOUNTER — Encounter (HOSPITAL_BASED_OUTPATIENT_CLINIC_OR_DEPARTMENT_OTHER): Payer: Self-pay | Admitting: Emergency Medicine

## 2022-10-10 DIAGNOSIS — M79671 Pain in right foot: Secondary | ICD-10-CM

## 2022-10-10 DIAGNOSIS — Z79899 Other long term (current) drug therapy: Secondary | ICD-10-CM | POA: Insufficient documentation

## 2022-10-10 DIAGNOSIS — M79673 Pain in unspecified foot: Secondary | ICD-10-CM | POA: Diagnosis present

## 2022-10-10 DIAGNOSIS — Z5329 Procedure and treatment not carried out because of patient's decision for other reasons: Secondary | ICD-10-CM | POA: Insufficient documentation

## 2022-10-10 HISTORY — DX: Disorder of thyroid, unspecified: E07.9

## 2022-10-10 NOTE — ED Triage Notes (Signed)
Pain to top of R foot and arch x 2 weeks. No known injury. States it is swollen.

## 2022-10-10 NOTE — ED Provider Notes (Signed)
[facility] EMERGENCY DEPT Provider Note   CSN: [id number] Arrival date & time: 10/10/22  1030     History  Chief Complaint  Patient presents with   Foot Pain    Dawn Norris is a 76 y.o. female complains of atraumatic foot pain for a couple weeks.  Has not tried any medications over-the-counter.  Also has not seen any outpatient providers.   Foot Pain       Home Medications Prior to Admission medications   Medication Sig Start Date End Date Taking? Authorizing Provider  ibuprofen (ADVIL) 200 MG tablet Take 200 mg by mouth 3 (three) times daily as needed (pain).    [provider]  levothyroxine (SYNTHROID) 50 MCG tablet TAKE 1 TABLET BY MOUTH EVERY TUESDAY, THURSDAY, SATURDAY, AND SUNDAY. NO REFILLS UNTIL APPT. 06/28/22   Nche, Charlene Brooke, NP  levothyroxine (SYNTHROID) 75 MCG tablet TAKE 1 TABLET BY MOUTH ON MONDAY, [facility] AND FRIDAY 07/17/22   Nche, Charlene Brooke, NP  medroxyPROGESTERone (PROVERA) 10 MG tablet Take 10 mg by mouth daily.    [provider]  Multiple Vitamin (MULTIVITAMIN WITH MINERALS) TABS tablet Take 1 tablet by mouth every morning.    [provider]  Ospemifene (OSPHENA) 60 MG TABS Take 60 mg by mouth daily with breakfast.    [provider]      Allergies    Codeine, Epinephrine, Erythromycin, and Morphine    Review of Systems   Review of Systems  Physical Exam Updated Vital Signs BP (!) 100/58   Pulse 71   Temp 98.6 F (37 C) (Oral)   Resp 18   Ht '5\' 5"'$  (1.651 m)   Wt 60.8 kg   SpO2 100%   BMI 22.30 kg/m  Physical Exam Vitals and nursing note reviewed.  Constitutional:      Appearance: Normal appearance.  HENT:     Head: Normocephalic and atraumatic.  Eyes:     General: No scleral icterus.    Conjunctiva/sclera: Conjunctivae normal.  Pulmonary:     Effort: Pulmonary effort is normal. No respiratory distress.  Musculoskeletal:     Comments: Full range of motion of the right  ankle and all MTPs.  No appreciable swelling.  Normal DP pulse.  Skin:    Findings: No rash.  Neurological:     Mental Status: She is alert.  Psychiatric:        Mood and Affect: Mood normal.     ED Results / Procedures / Treatments   Labs (all labs ordered are listed, but only abnormal results are displayed) Labs Reviewed - No data to display  EKG None  Radiology DG Foot Complete Right  Result Date: 10/10/2022 CLINICAL DATA:  Right foot pain for 2 weeks without known injury. EXAM: RIGHT FOOT COMPLETE - 3+ VIEW COMPARISON:  None Available. FINDINGS: There is no evidence of fracture or dislocation. There is no evidence of arthropathy or other focal bone abnormality. Soft tissues are unremarkable. IMPRESSION: Negative. Electronically Signed   By: Marijo Conception M.D.   On: 10/10/2022 11:31    Procedures Procedures   Medications Ordered in ED Medications - No data to display  ED Course/ Medical Decision Making/ A&P                           Medical Decision Making Amount and/or Complexity of Data Reviewed Radiology: ordered.   76 year old female presenting today due to foot pain.  Has been going  on for couple weeks.  She says that she has an appointment with her PCP next week but she cannot wait.  Despite this she tells me that she has not tried any over-the-counter pain medications.  She says "why would I take medications."  We discussed that many people take things such as NSAIDs and Tylenol when they have pain and she reported that if we were going to do nothing she could go see her primary care who will probably do nothing.  We discussed orthopedic injuries that require emergency room evaluation and otherwise she can follow-up outpatient.  Given over-the-counter options.  She was offered a walking boot because she says that walking or bearing weight on it makes it worse however she says "why would I wear something when I do not even like wearing shoes.  That will just make it  worse."  Patient reports "can I go" I told her that she should wait for her discharge papers however she ambulated out of the department without a limp or difficulty.  Final Clinical Impression(s) / ED Diagnoses Final diagnoses:  Foot pain, right    Rx / DC Orders ED Discharge Orders     None      Results and diagnoses were explained to the patient. Return precautions discussed in full. Patient had no additional questions and expressed complete understanding.   This chart was dictated using voice recognition software.  Despite best efforts to proofread,  errors can occur which can change the documentation meaning.     Rhae Hammock, PA-C 10/10/22 1431    Gareth Morgan, MD 10/10/22 2351

## 2022-10-13 ENCOUNTER — Encounter: Payer: Self-pay | Admitting: Nurse Practitioner

## 2022-10-13 ENCOUNTER — Other Ambulatory Visit: Payer: Self-pay | Admitting: Nurse Practitioner

## 2022-10-13 DIAGNOSIS — E039 Hypothyroidism, unspecified: Secondary | ICD-10-CM

## 2022-10-13 DIAGNOSIS — M79671 Pain in right foot: Secondary | ICD-10-CM

## 2022-10-13 MED ORDER — LEVOTHYROXINE SODIUM 75 MCG PO TABS: ORAL_TABLET | ORAL | 0 refills | Status: DC

## 2022-10-13 MED ORDER — LEVOTHYROXINE SODIUM 50 MCG PO TABS: ORAL_TABLET | ORAL | 0 refills | Status: DC

## 2022-10-16 HISTORY — PX: VAGINAL HYSTERECTOMY: SUR661

## 2022-10-18 ENCOUNTER — Telehealth: Payer: Self-pay

## 2022-10-18 NOTE — Telephone Encounter (Signed)
        Patient  visited [facility] on 12/26  Telephone encounter attempt :  1st  A HIPAA compliant voice message was left requesting a return call.  Instructed patient to call back     Yailine Ballard [facility], [place], Care Management  [phone] [address], [place], [place] [address] Phone: [phone] Email: [email or web address]       

## 2022-10-19 ENCOUNTER — Telehealth: Payer: Self-pay

## 2022-10-19 NOTE — Telephone Encounter (Signed)
        Patient  visited [facility] on 12/26     Telephone encounter attempt :  2nd   A HIPAA compliant voice message was left requesting a return call.  Instructed patient to call back .    [place], Care Management  [phone] [address], [place],  [address] Phone: [phone] Email: [email or web address]

## 2022-10-25 ENCOUNTER — Telehealth: Payer: Self-pay | Admitting: Nurse Practitioner

## 2022-10-25 ENCOUNTER — Encounter: Payer: Self-pay | Admitting: Nurse Practitioner

## 2022-10-25 ENCOUNTER — Encounter: Payer: Self-pay | Admitting: Internal Medicine

## 2022-10-25 ENCOUNTER — Telehealth: Payer: Self-pay | Admitting: Cardiology

## 2022-10-25 ENCOUNTER — Ambulatory Visit (INDEPENDENT_AMBULATORY_CARE_PROVIDER_SITE_OTHER): Payer: Medicare Other | Admitting: Nurse Practitioner

## 2022-10-25 VITALS — BP 98/60 | HR 74 | Temp 98.7°F | Resp 18 | Ht 65.0 in | Wt 136.0 lb

## 2022-10-25 DIAGNOSIS — I480 Paroxysmal atrial fibrillation: Secondary | ICD-10-CM | POA: Insufficient documentation

## 2022-10-25 DIAGNOSIS — M79671 Pain in right foot: Secondary | ICD-10-CM | POA: Diagnosis not present

## 2022-10-25 DIAGNOSIS — E039 Hypothyroidism, unspecified: Secondary | ICD-10-CM | POA: Diagnosis not present

## 2022-10-25 DIAGNOSIS — Z8679 Personal history of other diseases of the circulatory system: Secondary | ICD-10-CM | POA: Diagnosis not present

## 2022-10-25 DIAGNOSIS — E7800 Pure hypercholesterolemia, unspecified: Secondary | ICD-10-CM

## 2022-10-25 DIAGNOSIS — Z01818 Encounter for other preprocedural examination: Secondary | ICD-10-CM

## 2022-10-25 LAB — HEPATIC FUNCTION PANEL
ALT: 11 U/L (ref 0–35)
AST: 16 U/L (ref 0–37)
Albumin: 4.1 g/dL (ref 3.5–5.2)
Alkaline Phosphatase: 46 U/L (ref 39–117)
Bilirubin, Direct: 0.1 mg/dL (ref 0.0–0.3)
Total Bilirubin: 0.4 mg/dL (ref 0.2–1.2)
Total Protein: 6.7 g/dL (ref 6.0–8.3)

## 2022-10-25 LAB — CBC
HCT: 41.2 % (ref 36.0–46.0)
Hemoglobin: 13.8 g/dL (ref 12.0–15.0)
MCHC: 33.4 g/dL (ref 30.0–36.0)
MCV: 94 fl (ref 78.0–100.0)
Platelets: 282 10*3/uL (ref 150.0–400.0)
RBC: 4.38 Mil/uL (ref 3.87–5.11)
RDW: 13.2 % (ref 11.5–15.5)
WBC: 5.9 10*3/uL (ref 4.0–10.5)

## 2022-10-25 LAB — LIPID PANEL
Cholesterol: 218 mg/dL — ABNORMAL HIGH (ref 0–200)
HDL: 65 mg/dL (ref >39.00)
LDL Cholesterol: 126 mg/dL — ABNORMAL HIGH (ref 0–99)
NonHDL: 152.57
Total CHOL/HDL Ratio: 3
Triglycerides: 134 mg/dL (ref 0.0–149.0)
VLDL: 26.8 mg/dL (ref 0.0–40.0)

## 2022-10-25 LAB — BASIC METABOLIC PANEL
BUN: 21 mg/dL (ref 6–23)
CO2: 32 mEq/L (ref 19–32)
Calcium: 9.5 mg/dL (ref 8.4–10.5)
Chloride: 103 mEq/L (ref 96–112)
Creatinine, Ser: 0.85 mg/dL (ref 0.40–1.20)
GFR: 66.63 mL/min (ref >60.00)
Glucose, Bld: 91 mg/dL (ref 70–99)
Potassium: 4.4 mEq/L (ref 3.5–5.1)
Sodium: 142 mEq/L (ref 135–145)

## 2022-10-25 LAB — TSH: TSH: 3.79 u[IU]/mL (ref 0.35–5.50)

## 2022-10-25 LAB — T4, FREE: Free T4: 0.98 ng/dL (ref 0.60–1.60)

## 2022-10-25 MED ORDER — LEVOTHYROXINE SODIUM 50 MCG PO TABS: ORAL_TABLET | ORAL | 1 refills | Status: DC

## 2022-10-25 MED ORDER — LEVOTHYROXINE SODIUM 75 MCG PO TABS: ORAL_TABLET | ORAL | 1 refills | Status: DC

## 2022-10-25 NOTE — Telephone Encounter (Signed)
Cardiology consult is needed to clear her for upcoming hysterectomy. I entered referral

## 2022-10-25 NOTE — Assessment & Plan Note (Addendum)
Onset several weeks ago, but worse in last 2weeks. Pain is worse with weight bearing activity. 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

## 2022-10-25 NOTE — Telephone Encounter (Signed)
error 

## 2022-10-25 NOTE — Assessment & Plan Note (Addendum)
Possible paroxysmal A-fib? 1st episode in 2015 after administration of epinephrine during dental procedure. Unsure about duration of rhythm 2nd episode 09/2022 during hysteroscopy with D&C by [facility], metoprolol and esmolol IV administered, rate at 110s-130s per anesthesiologist report. unsure about duration of rhythm. No Hx of CVA or PE or DVT or syncope She has upcoming hysterectomy 11/22/2022.  Entered referral to cardiology

## 2022-10-25 NOTE — Assessment & Plan Note (Signed)
" >>  ASSESSMENT AND PLAN FOR HISTORY OF ATRIAL FIBRILLATION WRITTEN ON 10/25/2022  4:17 PM BY Corrigan Kretschmer LUM, NP  Possible paroxysmal A-fib? 1st episode in 2015 after administration of epinephrine during dental procedure. Unsure about duration of rhythm 2nd episode 09/2022 during hysteroscopy with D&C by [facility], metoprolol  and esmolol IV administered, rate at 110s-130s per anesthesiologist report. unsure about duration of rhythm. No Hx of CVA or PE or DVT or syncope She has upcoming hysterectomy 11/22/2022.  Entered referral to cardiology "

## 2022-10-25 NOTE — Progress Notes (Signed)
Established Patient Visit  Patient: Dawn Norris   DOB: [birth year]   76 y.o. Female  MRN: [id number] Visit Date: 10/25/2022  Subjective:    Chief Complaint  Patient presents with   Hypothyroidism   Follow-up   HPI Hypothyroidism Repeat Tsh and T4 today: stable Med refill (7mg and 749m) sent  Right foot pain Onset several weeks ago, but worse in last 2weeks. Pain is worse with weight bearing activity. 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 1st episode in 2015 after administration of epinephrine during dental procedure. Unsure about duration of rhythm 2nd episode 09/2022 during hysteroscopy with D&C by AtrDeferietetoprolol and esmolol IV administered, rate at 110s-130s per anesthesiologist report. unsure about duration of rhythm. No Hx of CVA or PE or DVT or syncope She has upcoming hysterectomy 11/22/2022.  Entered referral to cardiology  Reviewed medical, surgical, and social history today  Medications: Outpatient Medications Prior to Visit  Medication Sig   ibuprofen (ADVIL) 200 MG tablet Take 200 mg by mouth 3 (three) times daily as needed (pain).   medroxyPROGESTERone (PROVERA) 10 MG tablet Take 10 mg by mouth daily.   Multiple Vitamin (MULTIVITAMIN WITH MINERALS) TABS tablet Take 1 tablet by mouth every morning.   Ospemifene (OSPHENA) 60 MG TABS Take 60 mg by mouth daily with breakfast.   [DISCONTINUED] levothyroxine (SYNTHROID) 50 MCG tablet TAKE 1 TABLET BY MOUTH EVERY TUESDAY, THURSDAY, SATURDAY, AND SUNDAY.   [DISCONTINUED] levothyroxine (SYNTHROID) 75 MCG tablet TAKE 1 TABLET BY MOUTH ON MONDAY, WEDNESDAY AND FRIDAY   No facility-administered medications prior to visit.   Reviewed past medical and social history.   ROS per HPI above      Objective:   BP 98/60 (BP Location: Left Arm, Patient Position: Sitting, Cuff Size: Normal)   Pulse 74   Temp 98.7 F (37.1 C) (Oral)   Resp 18   Ht '5\' 5"'$  (1.651 m)   Wt 136 lb (61.7 kg)   SpO2 96%   BMI 22.63 kg/m      Physical Exam Vitals and nursing note reviewed.  Neck:     Thyroid: No thyroid mass, thyromegaly or thyroid tenderness.  Cardiovascular:     Rate and Rhythm: Normal rate and regular rhythm.     Pulses:          Dorsalis pedis pulses are 2+ on the right side and 2+ on the left side.       Posterior tibial pulses are 2+ on the right side and 2+ on the left side.     Heart sounds: Normal heart sounds.  Pulmonary:     Effort: Pulmonary effort is normal.     Breath sounds: Normal breath sounds.  Musculoskeletal:        General: Swelling and tenderness present.     Cervical back: Normal range of motion and neck supple.     Right lower leg: No edema.     Left lower leg: No edema.     Right foot: Normal range of motion. Swelling and tenderness present. No deformity, bunion, prominent metatarsal heads or crepitus. Normal pulse.     Left foot: Normal range of motion. No deformity, bunion or prominent metatarsal heads. Normal pulse.  Feet:     Right foot:  Skin integrity: Erythema present. No warmth or callus.     Toenail Condition: Right toenails are normal.     Left foot:     Skin integrity: No erythema, warmth or callus.     Toenail Condition: Left toenails are normal.     Comments: Tenderness, swelling and erythema between 2nd and 3rd metatarsal joint. Neurological:     Mental Status: She is alert and oriented to person, place, and time.  Psychiatric:        Mood and Affect: Mood normal.        Behavior: Behavior normal.        Thought Content: Thought content normal.     Results for orders placed or performed in visit on 10/25/22  TSH  Result Value Ref Range   TSH 3.79 0.35 - 5.50 uIU/mL  T4, free  Result Value Ref Range   Free T4 0.98 0.60 - 1.60 ng/dL  CBC   Result Value Ref Range   WBC 5.9 4.0 - 10.5 K/uL   RBC 4.38 3.87 - 5.11 Mil/uL   Platelets 282.0 150.0 - 400.0 K/uL   Hemoglobin 13.8 12.0 - 15.0 g/dL   HCT 41.2 36.0 - 46.0 %   MCV 94.0 78.0 - 100.0 fl   MCHC 33.4 30.0 - 36.0 g/dL   RDW 13.2 11.5 - 53.9 %  Basic metabolic panel  Result Value Ref Range   Sodium 142 135 - 145 mEq/L   Potassium 4.4 3.5 - 5.1 mEq/L   Chloride 103 96 - 112 mEq/L   CO2 32 19 - 32 mEq/L   Glucose, Bld 91 70 - 99 mg/dL   BUN 21 6 - 23 mg/dL   Creatinine, Ser 0.85 0.40 - 1.20 mg/dL   GFR 66.63 >60.00 mL/min   Calcium 9.5 8.4 - 10.5 mg/dL  Hepatic function panel  Result Value Ref Range   Total Bilirubin 0.4 0.2 - 1.2 mg/dL   Bilirubin, Direct 0.1 0.0 - 0.3 mg/dL   Alkaline Phosphatase 46 39 - 117 U/L   AST 16 0 - 37 U/L   ALT 11 0 - 35 U/L   Total Protein 6.7 6.0 - 8.3 g/dL   Albumin 4.1 3.5 - 5.2 g/dL  Lipid panel  Result Value Ref Range   Cholesterol 218 (H) 0 - 200 mg/dL   Triglycerides 134.0 0.0 - 149.0 mg/dL   HDL 65.00 >39.00 mg/dL   VLDL 26.8 0.0 - 40.0 mg/dL   LDL Cholesterol 126 (H) 0 - 99 mg/dL   Total CHOL/HDL Ratio 3    NonHDL 152.57       Assessment & Plan:    Problem List Items Addressed This Visit       Endocrine   Hypothyroidism    Repeat Tsh and T4 today: stable Med refill (79mg and 717m) sent      Relevant Medications   levothyroxine (SYNTHROID) 50 MCG tablet   levothyroxine (SYNTHROID) 75 MCG tablet   Other Relevant Orders   TSH (Completed)   T4, free (Completed)   CBC (Completed)   Basic metabolic panel (Completed)     Other   History of atrial fibrillation    Possible paroxysmal A-fib? 1st episode in 2015 after administration of epinephrine during dental procedure. Unsure about duration of rhythm 2nd episode 09/2022 during hysteroscopy with D&C by [facility]rol[facility] and esmolol IV administered, rate at 110s-130s per anesthesiologist report. unsure about duration of rhythm. No Hx of CVA or PE or  DVT or syncope She  has upcoming hysterectomy 11/22/2022.  Entered referral to cardiology      Relevant Orders   Ambulatory referral to Cardiology   Pure hypercholesterolemia - Primary   Relevant Orders   Hepatic function panel (Completed)   Lipid panel (Completed)   Right foot pain    Onset several weeks ago, but worse in last 2weeks. Pain is worse with weight bearing activity. 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     CWilfred Lacy NP

## 2022-10-25 NOTE — Patient Instructions (Signed)
Go to lab Will consult with cardiology.

## 2022-10-25 NOTE — Telephone Encounter (Signed)
Dr. Gardiner Rhyme spoke to NP-PCP to refer to cardiology for new patient appointment.

## 2022-10-25 NOTE — Telephone Encounter (Signed)
[name], NP, calling to speak with DOD.

## 2022-10-25 NOTE — Assessment & Plan Note (Addendum)
Repeat Tsh and T4 today: stable Med refill (30mg and 735m) sent

## 2022-10-25 NOTE — Telephone Encounter (Signed)
Pt is saying she missed her call and would like a call back at [phone]

## 2022-10-26 ENCOUNTER — Other Ambulatory Visit: Payer: Self-pay | Admitting: Podiatry

## 2022-10-26 ENCOUNTER — Ambulatory Visit (INDEPENDENT_AMBULATORY_CARE_PROVIDER_SITE_OTHER): Payer: Medicare Other | Admitting: Podiatry

## 2022-10-26 ENCOUNTER — Ambulatory Visit (INDEPENDENT_AMBULATORY_CARE_PROVIDER_SITE_OTHER): Payer: Medicare Other

## 2022-10-26 ENCOUNTER — Encounter: Payer: Self-pay | Admitting: Podiatry

## 2022-10-26 DIAGNOSIS — M7751 Other enthesopathy of right foot: Secondary | ICD-10-CM

## 2022-10-26 DIAGNOSIS — M79671 Pain in right foot: Secondary | ICD-10-CM

## 2022-10-26 MED ORDER — TRIAMCINOLONE ACETONIDE 10 MG/ML IJ SUSP
10.0000 mg | Freq: Once | INTRAMUSCULAR | Status: AC
  Administered 2022-10-26: 10 mg

## 2022-10-26 NOTE — Progress Notes (Signed)
Subjective:   Patient ID: Dawn Norris, female   DOB: 77 y.o.   MRN: [id number]   HPI Patient presents with a lot of pain on top of her right foot and states that there is been a lot of fluid buildup and it is occurred over the last month.  She did have a neuroma done about 20 years ago but this feels different and states it is very hard to walk on and her whole foot has started to hurt secondary to change in gait.  Patient does not smoke likes to be active   Review of Systems  All other systems reviewed and are negative.       Objective:  Physical Exam Vitals and nursing note reviewed.  Constitutional:      Appearance: She is well-developed.  Pulmonary:     Effort: Pulmonary effort is normal.  Musculoskeletal:        General: Normal range of motion.  Skin:    General: Skin is warm.  Neurological:     Mental Status: She is alert.     Neurovascular status intact muscle strength found to be adequate range of motion adequate with inflammation pain of the second and third metatarsal phalangeal joint right with fluid buildup around the joint surfaces and pain with pressure.  Patient is noted to have good digital perfusion well-oriented x 3     Assessment:  Inflammatory capsulitis of the second and third MPJ right which is probably secondary to excessive gait pattern with fluid around the joints and probable compensation pain with other discomfort      Plan:  H&P reviewed condition E[place] treat this conservatively.  I went ahead today I did a forefoot block of the area I then aspirated the second and third MPJs getting out a small amount of clear fluid injected quarter cc dexamethasone Kenalog in each joint and applied thick pad to reduce pressure on the area.  May require immobilization may require other treatments depending on how this responds  X-rays at this time negative for signs of any stress fracture and did indicate slightly elongated second and third metatarsals

## 2022-10-26 NOTE — Telephone Encounter (Signed)
Pt advised via MyChart message. °

## 2022-10-26 NOTE — Telephone Encounter (Signed)
Attempted to call patient to schedule new patient appointment with Dr. Gardiner Rhyme.  Patient answered phone and stated she was in the car and can not talk right now, states "please hang up right now, thank you".   Will make referring provider aware.

## 2022-10-27 ENCOUNTER — Ambulatory Visit (INDEPENDENT_AMBULATORY_CARE_PROVIDER_SITE_OTHER): Payer: Medicare Other

## 2022-10-27 VITALS — Ht 65.0 in | Wt 130.0 lb

## 2022-10-27 DIAGNOSIS — Z0000 Encounter for general adult medical examination without abnormal findings: Secondary | ICD-10-CM

## 2022-10-27 NOTE — Progress Notes (Signed)
I connected with Dawn Norris today by telephone and verified that I am speaking with the correct person using two identifiers. Location patient: home Location provider: work Persons participating in the virtual visit: Dawn Norris, Glenna Durand LPN.   I discussed the limitations, risks, security and privacy concerns of performing an evaluation and management service by telephone and the availability of in person appointments. I also discussed with the patient that there may be a patient responsible charge related to this service. The patient expressed understanding and verbally consented to this telephonic visit.    Interactive audio and video telecommunications were attempted between this provider and patient, however failed, due to patient having technical difficulties OR patient did not have access to video capability.  We continued and completed visit with audio only.     Vital signs may be patient reported or missing.  Subjective:   Dawn Norris is a 77 y.o. female who presents for an Initial Medicare Annual Wellness Visit.  Review of Systems     Cardiac Risk Factors include: advanced age (>23mn, >>37women)     Objective:    Today's Vitals   10/27/22 1456  Weight: 130 lb (59 kg)  Height: '5\' 5"'$  (1.651 m)   Body mass index is 21.63 kg/m.     10/27/2022    2:58 PM 10/10/2022   11:14 AM 01/21/2022    2:15 PM  Advanced Directives  Does Patient Have a Medical Advance Directive? Yes Yes No  Type of [place]amed[place]of [place] will   Copy of [place]? No - copy requested      Current Medications (verified) Outpatient Encounter Medications as of 10/27/2022  Medication Sig   ibuprofen (ADVIL) 200 MG tablet Take 200 mg by mouth 3 (three) times daily as needed (pain).   levothyroxine (SYNTHROID) 50 MCG tablet TAKE 1 TABLET BY MOUTH EVERY TUESDAY, THURSDAY, SATURDAY, AND SUNDAY.    levothyroxine (SYNTHROID) 75 MCG tablet TAKE 1 TABLET BY MOUTH ON MONDAY, WEDNESDAY AND FRIDAY   medroxyPROGESTERone (PROVERA) 10 MG tablet Take 10 mg by mouth daily.   Multiple Vitamin (MULTIVITAMIN WITH MINERALS) TABS tablet Take 1 tablet by mouth every morning.   Ospemifene (OSPHENA) 60 MG TABS Take 60 mg by mouth daily with breakfast.   No facility-administered encounter medications on file as of 10/27/2022.    Allergies (verified) Codeine, Epinephrine, Erythromycin, and Morphine   History: Past Medical History:  Diagnosis Date   History of TIA (transient ischemic attack) 01/26/2022   Thyroid disease    Past Surgical History:  Procedure Laterality Date   CYSTOCELE REPAIR     RECTOCELE REPAIR     TONSILLECTOMY     History reviewed. No pertinent family history. Social History   Socioeconomic History   Marital status: Married    Spouse name: Not on file   Number of children: Not on file   Years of education: Not on file   Highest education level: Not on file  Occupational History   Not on file  Tobacco Use   Smoking status: Never   Smokeless tobacco: Never  Vaping Use   Vaping Use: Never used  Substance and Sexual Activity   Alcohol use: Yes    Comment: A glass of wine a night   Drug use: Never   Sexual activity: Not on file  Other Topics Concern   Not on file  Social History Narrative   Not on file  Social Determinants of Health   Financial Resource Strain: Low Risk  (10/27/2022)   Overall Financial Resource Strain (CARDIA)    Difficulty of Paying Living Expenses: Not hard at all  Food Insecurity: No Food Insecurity (10/27/2022)   Hunger Vital Sign    Worried About Running Out of Food in the Last Year: Never true    Ran Out of Food in the Last Year: Never true  Transportation Needs: No Transportation Needs (10/27/2022)   PRAPARE - Hydrologist (Medical): No    Lack of Transportation (Non-Medical): No  Physical Activity:  Insufficiently Active (10/27/2022)   Exercise Vital Sign    Days of Exercise per Week: 7 days    Minutes of Exercise per Session: 20 min  Stress: No Stress Concern Present (10/27/2022)   [place]    Feeling of Stress : Not at all  Social Connections: Not on file    Tobacco Counseling Counseling given: Not Answered   Clinical Intake:  Pre-visit preparation completed: Yes  Pain : No/denies pain     Nutritional Status: BMI of 19-24  Normal Diabetes: No  How often do you need to have someone help you when you read instructions, pamphlets, or other written materials from your doctor or pharmacy?: 1 - Never  Diabetic? no  Interpreter Needed?: No  Information entered by :: NAllen LPN   Activities of Daily Living    10/27/2022    2:59 PM 10/26/2022    2:41 PM  In your present state of health, do you have any difficulty performing the following activities:  Hearing? 0 0  Vision? 0 0  Difficulty concentrating or making decisions? 0 0  Walking or climbing stairs? 0 0  Dressing or bathing? 0 0  Doing errands, shopping? 0 0  Preparing Food and eating ? N N  Using the Toilet? N N  In the past six months, have you accidently leaked urine? N N  Do you have problems with loss of bowel control? N N  Managing your Medications? N N  Managing your Finances? N N  Housekeeping or managing your Housekeeping? N N    Patient Care Team: Nche, Charlene Brooke, NP as PCP - General (Internal Medicine)  Indicate any recent Medical Services you may have received from other than [facility] providers in the past year (date may be approximate).     Assessment:   This is a routine wellness examination for [place].  Hearing/Vision screen Vision Screening - Comments:: Regular eye exams,   Dietary issues and exercise activities discussed: Current Exercise Habits: Home exercise routine, Type of exercise: walking, Time (Minutes): 20,  Frequency (Times/Week): 7, Weekly Exercise (Minutes/Week): 140   Goals Addressed             This Visit's Progress    Patient Stated       10/27/2022, no goals       Depression Screen    10/27/2022    2:59 PM 10/25/2022    9:17 AM 09/30/2021    1:08 PM  PHQ 2/9 Scores  PHQ - 2 Score 0 0 0    Fall Risk    10/27/2022    2:59 PM 10/26/2022    2:41 PM 10/25/2022    9:17 AM 02/16/2021   12:58 PM  Fall Risk   Falls in the past year? 0  0 0  Number falls in past yr: 0 0 0 0  Injury with  Fall? 0 0 0 0  Risk for fall due to : Impaired balance/gait;History of fall(s)   No Fall Risks  Follow up Falls prevention discussed  Falls evaluation completed Falls evaluation completed    [place]:  Any stairs in or around the home? Yes  If so, are there any without handrails? No  Home free of loose throw rugs in walkways, pet beds, electrical cords, etc? Yes  Adequate lighting in your home to reduce risk of falls? Yes   ASSISTIVE DEVICES UTILIZED TO PREVENT FALLS:  Life alert? No  Use of a cane, walker or w/c? No  Grab bars in the bathroom? Yes  Shower chair or bench in shower? No  Elevated toilet seat or a handicapped toilet? Yes   TIMED UP AND GO:  Was the test performed? No .      Cognitive Function:        10/27/2022    2:59 PM  6CIT Screen  What Year? 0 points  What month? 0 points  What time? 0 points  Count back from 20 0 points  Months in reverse 0 points  Repeat phrase 0 points  Total Score 0 points    Immunizations  There is no immunization history on file for this patient.  TDAP status: Due, Education has been provided regarding the importance of this vaccine. Advised may receive this vaccine at local pharmacy or Health Dept. Aware to provide a copy of the vaccination record if obtained from local pharmacy or Health Dept. Verbalized acceptance and understanding.  Flu Vaccine status: Declined, Education has been  provided regarding the importance of this vaccine but patient still declined. Advised may receive this vaccine at local pharmacy or Health Dept. Aware to provide a copy of the vaccination record if obtained from local pharmacy or Health Dept. Verbalized acceptance and understanding.  Pneumococcal vaccine status: Declined,  Education has been provided regarding the importance of this vaccine but patient still declined. Advised may receive this vaccine at local pharmacy or Health Dept. Aware to provide a copy of the vaccination record if obtained from local pharmacy or Health Dept. Verbalized acceptance and understanding.   Covid-19 vaccine status: Declined, Education has been provided regarding the importance of this vaccine but patient still declined. Advised may receive this vaccine at local pharmacy or Health Dept.or vaccine clinic. Aware to provide a copy of the vaccination record if obtained from local pharmacy or Health Dept. Verbalized acceptance and understanding.  Qualifies for Shingles Vaccine? Yes   Zostavax completed No   Shingrix Completed?: No.    Education has been provided regarding the importance of this vaccine. Patient has been advised to call insurance company to determine out of pocket expense if they have not yet received this vaccine. Advised may also receive vaccine at local pharmacy or Health Dept. Verbalized acceptance and understanding.  Screening Tests Health Maintenance  Topic Date Due   Medicare Annual Wellness (AWV)  Never done   COVID-19 Vaccine (1) 11/10/2022 (Originally 07/31/1951)   INFLUENZA VACCINE  01/14/2023 (Originally 05/16/2022)   Zoster Vaccines- Shingrix (1 of 2) 01/24/2023 (Originally 07/30/1965)   Pneumonia Vaccine 40+ Years old (1 - PCV) 10/26/2023 (Originally 07/31/2011)   HPV VACCINES  Aged Out   DTaP/Tdap/Td  Discontinued   DEXA SCAN  Discontinued   Hepatitis C Screening  Discontinued    Health Maintenance  Health Maintenance Due  Topic Date  Due   Medicare Annual Wellness (AWV)  Never done  Colorectal cancer screening: No longer required.   Mammogram status: no longer required  Bone Density status: decline  Lung Cancer Screening: (Low Dose CT Chest recommended if Age 71-80 years, 30 pack-year currently smoking OR have quit w/in 15years.) does not qualify.   Lung Cancer Screening Referral: no  Additional Screening:  Hepatitis C Screening: does qualify;  Vision Screening: Recommended annual ophthalmology exams for early detection of glaucoma and other disorders of the eye. Is the patient up to date with their annual eye exam?  Yes  Who is the provider or what is the name of the office in which the patient attends annual eye exams?  If pt is not established with a provider, would they like to be referred to a provider to establish care? No .   Dental Screening: Recommended annual dental exams for proper oral hygiene  Community Resource Referral / Chronic Care Management: CRR required this visit?  No   CCM required this visit?  No      Plan:     I have personally reviewed and noted the following in the patient's chart:   Medical and social history Use of alcohol, tobacco or illicit drugs  Current medications and supplements including opioid prescriptions. Patient is not currently taking opioid prescriptions. Functional ability and status Nutritional status Physical activity Advanced directives List of other physicians Hospitalizations, surgeries, and ER visits in previous 12 months Vitals Screenings to include cognitive, depression, and falls Referrals and appointments  In addition, I have reviewed and discussed with patient certain preventive protocols, quality metrics, and best practice recommendations. A written personalized care plan for preventive services as well as general preventive health recommendations were provided to patient.     Kellie Simmering, LPN   10/24/3233   Nurse Notes:  none  Due to this being a virtual visit, the after visit summary with patients personalized plan was offered to patient via mail or my-chart.  Patient would like to access on my-chart

## 2022-10-27 NOTE — Patient Instructions (Signed)
Dawn Norris , Thank you for taking time to come for your Medicare Wellness Visit. I appreciate your ongoing commitment to your health goals. Please review the following plan we discussed and let me know if I can assist you in the future.   These are the goals we discussed:  Goals   None     This is a list of the screening recommended for you and due dates:  Health Maintenance  Topic Date Due   Medicare Annual Wellness Visit  Never done   COVID-19 Vaccine (1) 11/10/2022*   Flu Shot  01/14/2023*   Zoster (Shingles) Vaccine (1 of 2) 01/24/2023*   Pneumonia Vaccine (1 - PCV) 10/26/2023*   HPV Vaccine  Aged Out   DTaP/Tdap/Td vaccine  Discontinued   DEXA scan (bone density measurement)  Discontinued   Hepatitis C Screening: USPSTF Recommendation to screen - Ages 16-79 yo.  Discontinued  *Topic was postponed. The date shown is not the original due date.    Advanced directives: Please bring a copy of your POA (Power of Attorney) and/or Living Will to your next appointment.   Conditions/risks identified: none  Next appointment: Follow up in one year for your annual wellness visit    Preventive Care 65 Years and Older, Female Preventive care refers to lifestyle choices and visits with your health care provider that can promote health and wellness. What does preventive care include? A yearly physical exam. This is also called an annual well check. Dental exams once or twice a year. Routine eye exams. Ask your health care provider how often you should have your eyes checked. Personal lifestyle choices, including: Daily care of your teeth and gums. Regular physical activity. Eating a healthy diet. Avoiding tobacco and drug use. Limiting alcohol use. Practicing safe sex. Taking low-dose aspirin every day. Taking vitamin and mineral supplements as recommended by your health care provider. What happens during an annual well check? The services and screenings done by your health care  provider during your annual well check will depend on your age, overall health, lifestyle risk factors, and family history of disease. Counseling  Your health care provider may ask you questions about your: Alcohol use. Tobacco use. Drug use. Emotional well-being. Home and relationship well-being. Sexual activity. Eating habits. History of falls. Memory and ability to understand (cognition). Work and work Statistician. Reproductive health. Screening  You may have the following tests or measurements: Height, weight, and BMI. Blood pressure. Lipid and cholesterol levels. These may be checked every 5 years, or more frequently if you are over 46 years old. Skin check. Lung cancer screening. You may have this screening every year starting at age 67 if you have a 30-pack-year history of smoking and currently smoke or have quit within the past 15 years. Fecal occult blood test (FOBT) of the stool. You may have this test every year starting at age 88. Flexible sigmoidoscopy or colonoscopy. You may have a sigmoidoscopy every 5 years or a colonoscopy every 10 years starting at age 83. Hepatitis C blood test. Hepatitis B blood test. Sexually transmitted disease (STD) testing. Diabetes screening. This is done by checking your blood sugar (glucose) after you have not eaten for a while (fasting). You may have this done every 1-3 years. Bone density scan. This is done to screen for osteoporosis. You may have this done starting at age 30. Mammogram. This may be done every 1-2 years. Talk to your health care provider about how often you should have regular mammograms. Talk  with your health care provider about your test results, treatment options, and if necessary, the need for more tests. Vaccines  Your health care provider may recommend certain vaccines, such as: Influenza vaccine. This is recommended every year. Tetanus, diphtheria, and acellular pertussis (Tdap, Td) vaccine. You may need a Td  booster every 10 years. Zoster vaccine. You may need this after age 38. Pneumococcal 13-valent conjugate (PCV13) vaccine. One dose is recommended after age 41. Pneumococcal polysaccharide (PPSV23) vaccine. One dose is recommended after age 32. Talk to your health care provider about which screenings and vaccines you need and how often you need them. This information is not intended to replace advice given to you by your health care provider. Make sure you discuss any questions you have with your health care provider. Document Released: 10/29/2015 Document Revised: 06/21/2016 Document Reviewed: 08/03/2015 Elsevier Interactive Patient Education  2017 [employer] Prevention in the Home Falls can cause injuries. They can happen to people of all ages. There are many things you can do to make your home safe and to help prevent falls. What can I do on the outside of my home? Regularly fix the edges of walkways and driveways and fix any cracks. Remove anything that might make you trip as you walk through a door, such as a raised step or threshold. Trim any bushes or trees on the path to your home. Use bright outdoor lighting. Clear any walking paths of anything that might make someone trip, such as rocks or tools. Regularly check to see if handrails are loose or broken. Make sure that both sides of any steps have handrails. Any raised decks and porches should have guardrails on the edges. Have any leaves, snow, or ice cleared regularly. Use sand or salt on walking paths during winter. Clean up any spills in your garage right away. This includes oil or grease spills. What can I do in the bathroom? Use night lights. Install grab bars by the toilet and in the tub and shower. Do not use towel bars as grab bars. Use non-skid mats or decals in the tub or shower. If you need to sit down in the shower, use a plastic, non-slip stool. Keep the floor dry. Clean up any water that spills on the floor  as soon as it happens. Remove soap buildup in the tub or shower regularly. Attach bath mats securely with double-sided non-slip rug tape. Do not have throw rugs and other things on the floor that can make you trip. What can I do in the bedroom? Use night lights. Make sure that you have a light by your bed that is easy to reach. Do not use any sheets or blankets that are too big for your bed. They should not hang down onto the floor. Have a firm chair that has side arms. You can use this for support while you get dressed. Do not have throw rugs and other things on the floor that can make you trip. What can I do in the kitchen? Clean up any spills right away. Avoid walking on wet floors. Keep items that you use a lot in easy-to-reach places. If you need to reach something above you, use a strong step stool that has a grab bar. Keep electrical cords out of the way. Do not use floor polish or wax that makes floors slippery. If you must use wax, use non-skid floor wax. Do not have throw rugs and other things on the floor that can make you  trip. What can I do with my stairs? Do not leave any items on the stairs. Make sure that there are handrails on both sides of the stairs and use them. Fix handrails that are broken or loose. Make sure that handrails are as long as the stairways. Check any carpeting to make sure that it is firmly attached to the stairs. Fix any carpet that is loose or worn. Avoid having throw rugs at the top or bottom of the stairs. If you do have throw rugs, attach them to the floor with carpet tape. Make sure that you have a light switch at the top of the stairs and the bottom of the stairs. If you do not have them, ask someone to add them for you. What else can I do to help prevent falls? Wear shoes that: Do not have high heels. Have rubber bottoms. Are comfortable and fit you well. Are closed at the toe. Do not wear sandals. If you use a stepladder: Make sure that it is  fully opened. Do not climb a closed stepladder. Make sure that both sides of the stepladder are locked into place. Ask someone to hold it for you, if possible. Clearly mark and make sure that you can see: Any grab bars or handrails. First and last steps. Where the edge of each step is. Use tools that help you move around (mobility aids) if they are needed. These include: Canes. Walkers. Scooters. Crutches. Turn on the lights when you go into a dark area. Replace any light bulbs as soon as they burn out. Set up your furniture so you have a clear path. Avoid moving your furniture around. If any of your floors are uneven, fix them. If there are any pets around you, be aware of where they are. Review your medicines with your doctor. Some medicines can make you feel dizzy. This can increase your chance of falling. Ask your doctor what other things that you can do to help prevent falls. This information is not intended to replace advice given to you by your health care provider. Make sure you discuss any questions you have with your health care provider. Document Released: 07/29/2009 Document Revised: 03/09/2016 Document Reviewed: 11/06/2014 Elsevier Interactive Patient Education  2017 [employer].

## 2022-11-06 ENCOUNTER — Ambulatory Visit: Payer: Medicare Other | Attending: Cardiology | Admitting: Cardiology

## 2022-11-06 ENCOUNTER — Encounter: Payer: Self-pay | Admitting: Cardiology

## 2022-11-06 ENCOUNTER — Ambulatory Visit: Payer: Medicare Other | Attending: Cardiology

## 2022-11-06 VITALS — BP 138/80 | HR 74 | Ht 65.5 in | Wt 136.0 lb

## 2022-11-06 DIAGNOSIS — I480 Paroxysmal atrial fibrillation: Secondary | ICD-10-CM | POA: Insufficient documentation

## 2022-11-06 DIAGNOSIS — E785 Hyperlipidemia, unspecified: Secondary | ICD-10-CM | POA: Diagnosis not present

## 2022-11-06 DIAGNOSIS — Z01810 Encounter for preprocedural cardiovascular examination: Secondary | ICD-10-CM | POA: Insufficient documentation

## 2022-11-06 MED ORDER — APIXABAN 5 MG PO TABS: 5.0000 mg | ORAL_TABLET | Freq: Two times a day (BID) | ORAL | 3 refills | Status: DC

## 2022-11-06 MED ORDER — METOPROLOL SUCCINATE ER 25 MG PO TB24: 25.0000 mg | ORAL_TABLET | Freq: Every day | ORAL | 3 refills | Status: DC

## 2022-11-06 NOTE — Patient Instructions (Addendum)
Medication Instructions:  START metoprolol succinate (Toprol XL) 25 mg daily  START Eliquis 5 mg two times daily --hold Eliquis for 2 days prior to procedure.  Restart when ok with surgeon.    *If you need a refill on your cardiac medications before your next appointment, please call your pharmacy*  Testing/Procedures: Your physician has requested that you have an echocardiogram (ASAP/prior to procedure on 2/7). Echocardiography is a painless test that uses sound waves to create images of your heart. It provides your doctor with information about the size and shape of your heart and how well your heart's chambers and valves are working. This procedure takes approximately one hour. There are no restrictions for this procedure. Please do NOT wear cologne, perfume, aftershave, or lotions (deodorant is allowed). Please arrive 15 minutes prior to your appointment time.  ZIO XT- Long Term Monitor Instructions   Your physician has requested you wear a ZIO patch monitor for _14__ days.  This is a single patch monitor.   [employer] supplies one patch monitor per enrollment. Additional stickers are not available. Please do not apply patch if you will be having a Nuclear Stress Test, Echocardiogram, Cardiac CT, MRI, or Chest Xray during the period you would be wearing the monitor. The patch cannot be worn during these tests. You cannot remove and re-apply the ZIO XT patch monitor.  Your ZIO patch monitor will be sent Fed Ex from [employer] directly to your home address. It may take 3-5 days to receive your monitor after you have been enrolled.  Once you have received your monitor, please review the enclosed instructions. Your monitor has already been registered assigning a specific monitor serial # to you.  Billing and Patient Assistance Program Information   We have supplied [employer] with any of your insurance information on file for billing purposes. [employer] offers a sliding scale Patient  Assistance Program for patients that do not have insurance, or whose insurance does not completely cover the cost of the ZIO monitor.   You must apply for the Patient Assistance Program to qualify for this discounted rate.     To apply, please call [employer] at [phone], select option 4, then select option 2, and ask to apply for Patient Assistance Program.  Theodore Demark will ask your household income, and how many people are in your household.  They will quote your out-of-pocket cost based on that information.  [employer] will also be able to set up a 43-month interest-free payment plan if needed.  Applying the monitor   Shave hair from upper left chest.  Hold abrader disc by orange tab. Rub abrader in 40 strokes over the upper left chest as indicated in your monitor instructions.  Clean area with 4 enclosed alcohol pads. Let dry.  Apply patch as indicated in monitor instructions. Patch will be placed under collarbone on left side of chest with arrow pointing upward.  Rub patch adhesive wings for 2 minutes. Remove white label marked "1". Remove the white label marked "2". Rub patch adhesive wings for 2 additional minutes.  While looking in a mirror, press and release button in center of patch. A small green light will flash 3-4 times. This will be your only indicator that the monitor has been turned on. ?  Do not shower for the first 24 hours. You may shower after the first 24 hours.  Press the button if you feel a symptom. You will hear a small click. Record Date, Time and Symptom in the Patient Logbook.  When you are ready to remove the patch, follow instructions on the last 2 pages of the Patient Logbook. Stick patch monitor onto the last page of Patient Logbook.  Place Patient Logbook in the blue and white box.  Use locking tab on box and tape box closed securely.  The blue and white box has prepaid postage on it. Please place it in the mailbox as soon as possible. Your physician should have your test  results approximately 7 days after the monitor has been mailed back to [facility].  Call [place] at [phone] if you have questions regarding your ZIO XT patch monitor. Call them immediately if you see an orange light blinking on your monitor.  If your monitor falls off in less than 4 days, contact our Monitor department at [phone]. ?If your monitor becomes loose or falls off after 4 days call [employer] at [phone] for suggestions on securing your monitor.?  Follow-Up: At [facility], you and your health needs are our priority.  As part of our continuing mission to provide you with exceptional heart care, we have created designated Provider Care Teams.  These Care Teams include your primary Cardiologist (physician) and Advanced Practice Providers (APPs -  Physician Assistants and Nurse Practitioners) who all work together to provide you with the care you need, when you need it.  We recommend signing up for the patient portal called "MyChart".  Sign up information is provided on this After Visit Summary.  MyChart is used to connect with patients for Virtual Visits (Telemedicine).  Patients are able to view lab/test results, encounter notes, upcoming appointments, etc.  Non-urgent messages can be sent to your provider as well.   To learn more about what you can do with MyChart, go to [email or web address].    Your next appointment:   3 month(s)  Provider:   Dr. Gardiner Rhyme

## 2022-11-06 NOTE — Progress Notes (Signed)
Cardiology Office Note:    Date:  11/06/2022   ID:  Dawn Norris, DOB 1946-09-01, MRN [id number]  PCP:  Flossie Buffy, NP  Cardiologist:  None  Electrophysiologist:  None   Referring MD: Flossie Buffy, NP   Chief Complaint  Patient presents with   Atrial Fibrillation    History of Present Illness:    Dawn Norris is a 77 y.o. female with a hx of paroxysmal atrial relation, hyperlipidemia, TIA, hypothyroidism who is referred by [name], NP for evaluation of atrial fibrillation and preop evaluation.  She has had 2 known episodes of atrial fibrillation, first occurred in 2015 after administration of epinephrine during dental procedure.  Second episode occurred 09/2022 during hysteroscopy, rate was 110s to 130s per anesthesiology report.  She has upcoming hysterectomy planned 11/22/2022.  Denies any chest pain, dyspnea, lightheadedness, syncope, lower extremity edema, or palpitations.  Does not exercise due to foot pain.  Walks up stairs multiple times daily, no exertional symptoms.  Smoked from age 63 to 64, up to 1 pack per day.  Mother had rheumatic heart disease, had MI in 58s, died age 6 of MI.     Past Medical History:  Diagnosis Date   History of TIA (transient ischemic attack) 01/26/2022   Thyroid disease     Past Surgical History:  Procedure Laterality Date   CYSTOCELE REPAIR     RECTOCELE REPAIR     TONSILLECTOMY      Current Medications: Current Meds  Medication Sig   apixaban (ELIQUIS) 5 MG TABS tablet Take 1 tablet (5 mg total) by mouth 2 (two) times daily.   ibuprofen (ADVIL) 200 MG tablet Take 200 mg by mouth 3 (three) times daily as needed (pain).   levothyroxine (SYNTHROID) 50 MCG tablet TAKE 1 TABLET BY MOUTH EVERY TUESDAY, THURSDAY, SATURDAY, AND SUNDAY.   levothyroxine (SYNTHROID) 75 MCG tablet TAKE 1 TABLET BY MOUTH ON MONDAY, WEDNESDAY AND FRIDAY   medroxyPROGESTERone (PROVERA) 10 MG tablet Take 10 mg by mouth daily.   metoprolol  succinate (TOPROL XL) 25 MG 24 hr tablet Take 1 tablet (25 mg total) by mouth daily.   Multiple Vitamin (MULTIVITAMIN WITH MINERALS) TABS tablet Take 1 tablet by mouth every morning.   Ospemifene (OSPHENA) 60 MG TABS Take 60 mg by mouth daily with breakfast.     Allergies:   Codeine, Epinephrine, Erythromycin, and Morphine   Social History   Socioeconomic History   Marital status: Married    Spouse name: Not on file   Number of children: Not on file   Years of education: Not on file   Highest education level: Not on file  Occupational History   Not on file  Tobacco Use   Smoking status: Never   Smokeless tobacco: Never  Vaping Use   Vaping Use: Never used  Substance and Sexual Activity   Alcohol use: Yes    Comment: A glass of wine a night   Drug use: Never   Sexual activity: Not on file  Other Topics Concern   Not on file  Social History Narrative   Not on file   Social Determinants of Health   Financial Resource Strain: Low Risk  (10/27/2022)   Overall Financial Resource Strain (CARDIA)    Difficulty of Paying Living Expenses: Not hard at all  Food Insecurity: No Food Insecurity (10/27/2022)   Hunger Vital Sign    Worried About Running Out of Food in the Last Year: Never true    Ran  Out of Food in the Last Year: Never true  Transportation Needs: No Transportation Needs (10/27/2022)   PRAPARE - Hydrologist (Medical): No    Lack of Transportation (Non-Medical): No  Physical Activity: Insufficiently Active (10/27/2022)   Exercise Vital Sign    Days of Exercise per Week: 7 days    Minutes of Exercise per Session: 20 min  Stress: No Stress Concern Present (10/27/2022)   [place]    Feeling of Stress : Not at all  Social Connections: Not on file     Family History: Mother had rheumatic heart disease, had MI in 74s, died age 101 of MI.    ROS:   Please see the history of  present illness.     All other systems reviewed and are negative.  EKGs/Labs/Other Studies Reviewed:    The following studies were reviewed today:   EKG:   11/06/2022: Sinus rhythm, PVCs, rate 74  Recent Labs: 10/25/2022: ALT 11; BUN 21; Creatinine, Ser 0.85; Hemoglobin 13.8; Platelets 282.0; Potassium 4.4; Sodium 142; TSH 3.79  Recent Lipid Panel    Component Value Date/Time   CHOL 218 (H) 10/25/2022 1009   TRIG 134.0 10/25/2022 1009   HDL 65.00 10/25/2022 1009   CHOLHDL 3 10/25/2022 1009   VLDL 26.8 10/25/2022 1009   LDLCALC 126 (H) 10/25/2022 1009    Physical Exam:    VS:  BP 138/80   Pulse 74   Ht 5' 5.5" (1.664 m)   Wt 136 lb (61.7 kg)   SpO2 97%   BMI 22.29 kg/m     Wt Readings from Last 3 Encounters:  11/06/22 136 lb (61.7 kg)  10/27/22 130 lb (59 kg)  10/25/22 136 lb (61.7 kg)     GEN:  Well nourished, well developed in no acute distress HEENT: Normal NECK: No JVD; No carotid bruits LYMPHATICS: No lymphadenopathy CARDIAC: RRR, no murmurs, rubs, gallops RESPIRATORY:  Clear to auscultation without rales, wheezing or rhonchi  ABDOMEN: Soft, non-tender, non-distended MUSCULOSKELETAL:  No edema; No deformity  SKIN: Warm and dry NEUROLOGIC:  Alert and oriented x 3 PSYCHIATRIC:  Normal affect   ASSESSMENT:    1. Paroxysmal atrial fibrillation (HCC)   2. Pre-operative cardiovascular examination   3. Hyperlipidemia, unspecified hyperlipidemia type    PLAN:    Preop evaluation: Prior to hysterectomy.  Denies any anginal symptoms.  Good functional capacity, greater than 4 METS.  RCRI score 0.  Overall would classify as low risk for an intermediate risk surgery and no further cardiac workup recommended prior to surgery.  Recommend holding Eliquis x 2 days prior to surgery, can restart once okay from surgical standpoint  Paroxysmal atrial fibrillation: has had 2 known episodes of atrial fibrillation, first occurred in 2015 after administration of epinephrine  during dental procedure.  Second episode occurred 09/2022 during hysteroscopy, rate was 110s to 130s per anesthesiology report.  CHA2DS2-VASc score is 45 (female, age x 2); however there is also reported history of TIA, though she denies this, which would increase CHA2DS2-VASc score to 5 -Recommend starting Eliquis 5 mg twice daily -Start Toprol-XL 25 mg daily -Zio patch x 2 weeks to quantify A-fib burden -Echocardiogram  Hyperlipidemia: LDL 126 on 10/25/2022.  10-year ASCVD risk score 20%.  Recommend statin, will discuss further at next clinic visit  RTC in 3 months   Medication Adjustments/Labs and Tests Ordered: Current medicines are reviewed at length with the patient today.  Concerns regarding medicines are outlined above.  Orders Placed This Encounter  Procedures   LONG TERM MONITOR (3-14 DAYS)   EKG 12-Lead   ECHOCARDIOGRAM COMPLETE   Meds ordered this encounter  Medications   metoprolol succinate (TOPROL XL) 25 MG 24 hr tablet    Sig: Take 1 tablet (25 mg total) by mouth daily.    Dispense:  90 tablet    Refill:  3   apixaban (ELIQUIS) 5 MG TABS tablet    Sig: Take 1 tablet (5 mg total) by mouth 2 (two) times daily.    Dispense:  180 tablet    Refill:  3    Patient Instructions  Medication Instructions:  START metoprolol succinate (Toprol XL) 25 mg daily  START Eliquis 5 mg two times daily --hold Eliquis for 2 days prior to procedure.  Restart when ok with surgeon.    *If you need a refill on your cardiac medications before your next appointment, please call your pharmacy*  Testing/Procedures: Your physician has requested that you have an echocardiogram (ASAP/prior to procedure on 2/7). Echocardiography is a painless test that uses sound waves to create images of your heart. It provides your doctor with information about the size and shape of your heart and how well your heart's chambers and valves are working. This procedure takes approximately one hour. There are no  restrictions for this procedure. Please do NOT wear cologne, perfume, aftershave, or lotions (deodorant is allowed). Please arrive 15 minutes prior to your appointment time.  ZIO XT- Long Term Monitor Instructions   Your physician has requested you wear a ZIO patch monitor for _14__ days.  This is a single patch monitor.   [employer] supplies one patch monitor per enrollment. Additional stickers are not available. Please do not apply patch if you will be having a Nuclear Stress Test, Echocardiogram, Cardiac CT, MRI, or Chest Xray during the period you would be wearing the monitor. The patch cannot be worn during these tests. You cannot remove and re-apply the ZIO XT patch monitor.  Your ZIO patch monitor will be sent Fed Ex from [employer] directly to your home address. It may take 3-5 days to receive your monitor after you have been enrolled.  Once you have received your monitor, please review the enclosed instructions. Your monitor has already been registered assigning a specific monitor serial # to you.  Billing and Patient Assistance Program Information   We have supplied [employer] with any of your insurance information on file for billing purposes. [employer] offers a sliding scale Patient Assistance Program for patients that do not have insurance, or whose insurance does not completely cover the cost of the ZIO monitor.   You must apply for the Patient Assistance Program to qualify for this discounted rate.     To apply, please call [employer] at [phone], select option 4, then select option 2, and ask to apply for Patient Assistance Program.  Theodore Demark will ask your household income, and how many people are in your household.  They will quote your out-of-pocket cost based on that information.  [employer] will also be able to set up a 50-month interest-free payment plan if needed.  Applying the monitor   Shave hair from upper left chest.  Hold abrader disc by orange tab. Rub abrader in 40  strokes over the upper left chest as indicated in your monitor instructions.  Clean area with 4 enclosed alcohol pads. Let dry.  Apply patch as indicated in monitor instructions. Patch will  be placed under collarbone on left side of chest with arrow pointing upward.  Rub patch adhesive wings for 2 minutes. Remove white label marked "1". Remove the white label marked "2". Rub patch adhesive wings for 2 additional minutes.  While looking in a mirror, press and release button in center of patch. A small green light will flash 3-4 times. This will be your only indicator that the monitor has been turned on. ?  Do not shower for the first 24 hours. You may shower after the first 24 hours.  Press the button if you feel a symptom. You will hear a small click. Record Date, Time and Symptom in the Patient Logbook.  When you are ready to remove the patch, follow instructions on the last 2 pages of the Patient Logbook. Stick patch monitor onto the last page of Patient Logbook.  Place Patient Logbook in the blue and white box.  Use locking tab on box and tape box closed securely.  The blue and white box has prepaid postage on it. Please place it in the mailbox as soon as possible. Your physician should have your test results approximately 7 days after the monitor has been mailed back to [facility].  Call [place] at [phone] if you have questions regarding your ZIO XT patch monitor. Call them immediately if you see an orange light blinking on your monitor.  If your monitor falls off in less than 4 days, contact our Monitor department at [phone]. ?If your monitor becomes loose or falls off after 4 days call [employer] at [phone] for suggestions on securing your monitor.?  Follow-Up: At [facility], you and your health needs are our priority.  As part of our continuing mission to provide you with exceptional heart care, we have created designated Provider Care Teams.   These Care Teams include your primary Cardiologist (physician) and Advanced Practice Providers (APPs -  Physician Assistants and Nurse Practitioners) who all work together to provide you with the care you need, when you need it.  We recommend signing up for the patient portal called "MyChart".  Sign up information is provided on this After Visit Summary.  MyChart is used to connect with patients for Virtual Visits (Telemedicine).  Patients are able to view lab/test results, encounter notes, upcoming appointments, etc.  Non-urgent messages can be sent to your provider as well.   To learn more about what you can do with MyChart, go to [email or web address].    Your next appointment:   3 month(s)  Provider:   Dr. Gardiner Rhyme     Signed, Donato Heinz, MD  11/06/2022 2:19 PM    [place]

## 2022-11-06 NOTE — Progress Notes (Unsigned)
Enrolled for [employer] to mail a ZIO XT long term holter monitor to the patients address on file on 11/29/2022.

## 2022-11-08 ENCOUNTER — Ambulatory Visit (INDEPENDENT_AMBULATORY_CARE_PROVIDER_SITE_OTHER): Payer: Medicare Other

## 2022-11-08 DIAGNOSIS — Z01810 Encounter for preprocedural cardiovascular examination: Secondary | ICD-10-CM

## 2022-11-08 DIAGNOSIS — I480 Paroxysmal atrial fibrillation: Secondary | ICD-10-CM

## 2022-11-08 LAB — ECHOCARDIOGRAM COMPLETE
Area-P 1/2: 3.42 cm2
S' Lateral: 2.26 cm

## 2022-11-09 ENCOUNTER — Telehealth: Payer: Self-pay | Admitting: Pharmacist

## 2022-11-09 NOTE — Telephone Encounter (Signed)
Received fax that Eliquis needs PA.  Key: [id number]

## 2022-11-11 ENCOUNTER — Encounter: Payer: Self-pay | Admitting: Cardiology

## 2022-11-13 NOTE — Telephone Encounter (Signed)
Yes that is fine to do Xarelto instead of Eliquis

## 2022-11-13 NOTE — Telephone Encounter (Signed)
Viewed PA on Covermymeds  Decision Notes: Eliquis is denied for not meeting the step therapy requirement. Medication authorization requires the following: One of the following: (1) You need to try Xarelto. (2) Your doctor needs to give [place] specific medical reasons why you cannot take the covered drug(s). Reviewed by: Murvin Natal, R.Ph.

## 2022-11-14 ENCOUNTER — Encounter: Payer: Self-pay | Admitting: Cardiology

## 2022-11-15 ENCOUNTER — Ambulatory Visit (INDEPENDENT_AMBULATORY_CARE_PROVIDER_SITE_OTHER): Payer: Medicare Other | Admitting: Podiatry

## 2022-11-15 ENCOUNTER — Encounter: Payer: Self-pay | Admitting: Podiatry

## 2022-11-15 DIAGNOSIS — M7751 Other enthesopathy of right foot: Secondary | ICD-10-CM

## 2022-11-15 DIAGNOSIS — M779 Enthesopathy, unspecified: Secondary | ICD-10-CM | POA: Diagnosis not present

## 2022-11-15 MED ORDER — RIVAROXABAN 20 MG PO TABS: 20.0000 mg | ORAL_TABLET | Freq: Every day | ORAL | 11 refills | Status: DC

## 2022-11-15 NOTE — Telephone Encounter (Signed)
See duplicate message.  ?

## 2022-11-15 NOTE — Telephone Encounter (Signed)
From [place]  Her CrCl is 55, she qualifies for Xarelto '20mg'$  daily for afib indication. Xarelto should be taken with the largest meal of the day for optimal absorption/efficacy.

## 2022-11-15 NOTE — Progress Notes (Signed)
Subjective:   Patient ID: Dawn Norris, female   DOB: 77 y.o.   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

## 2022-11-21 HISTORY — PX: ABDOMINAL HYSTERECTOMY: SHX81

## 2022-12-06 DIAGNOSIS — I480 Paroxysmal atrial fibrillation: Secondary | ICD-10-CM

## 2023-01-03 ENCOUNTER — Encounter: Payer: Self-pay | Admitting: Cardiology

## 2023-01-03 NOTE — Telephone Encounter (Signed)
Monitor showed frequent PACs but no A-fib.  PACs frequently initiate A-fib, so it is important to suppress these extra beats.  Would continue metoprolol, which helps suppress the PACs.  With frequent PACs she is at risk of developing more A-fib, and even though we did not see it on the monitor, would recommend continuing Xarelto.

## 2023-01-07 IMAGING — MR MR LUMBAR SPINE W/O CM
4 of 5 series · 17 of 48 positions shown · non-contrast
Comparison: Radiographs January 27, 2022.

CLINICAL DATA: Lumbar radiculopathy 5AK.BT (IOZ-MA-CM).

EXAM:
MRI LUMBAR SPINE WITHOUT CONTRAST
TECHNIQUE: Multiplanar, multisequence MR imaging of the lumbar spine was
performed. No intravenous contrast was administered.

[Series 13: T2 · sagittal · 4.0mm · 0.73mm/px · 4 of 8 slices shown (1 of 2)]
[im 1/8]
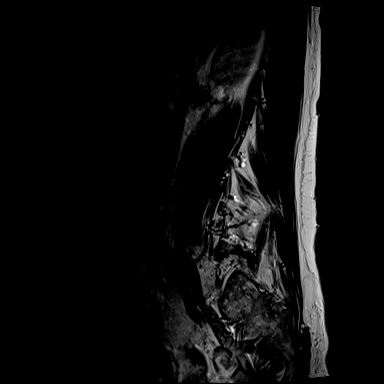
[im 3/8]
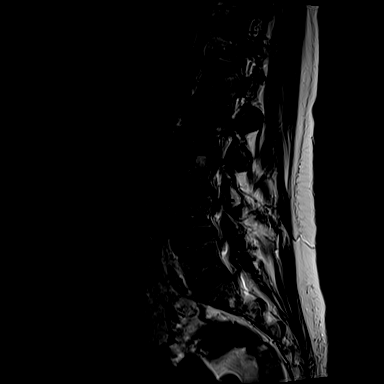
[im 5/8]
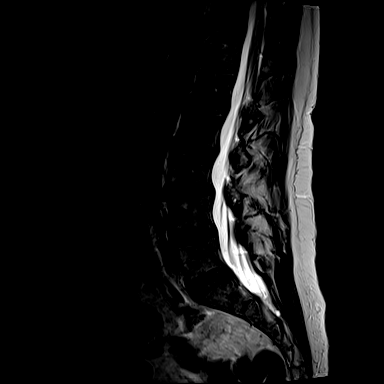
[im 8/8]
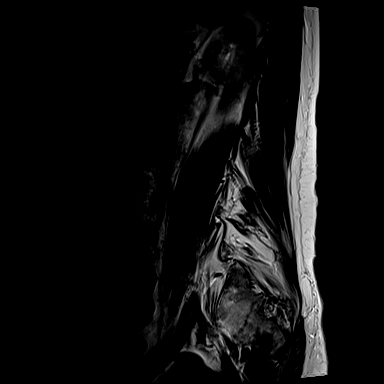

[Series 14: T1 · sagittal · 4.0mm · 0.88mm/px · 3 of 15 slices shown (1 of 2)]
[im 3/15]
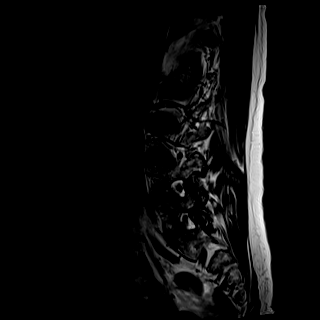
[im 9/15]
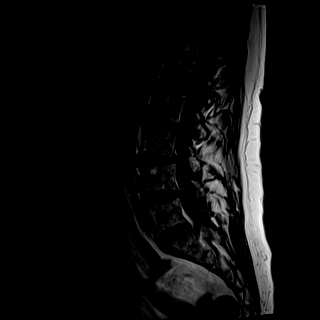
[im 15/15]
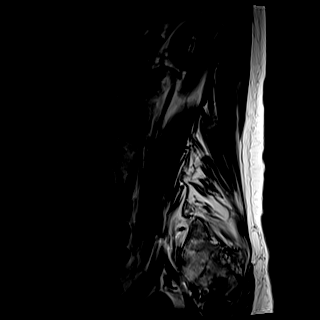

[Series 18: T1 · axial · 4.0mm · 0.28mm/px · z∈[+18,+182]mm · 3 of 40 slices shown (2 of 2)]
[im 6/40]
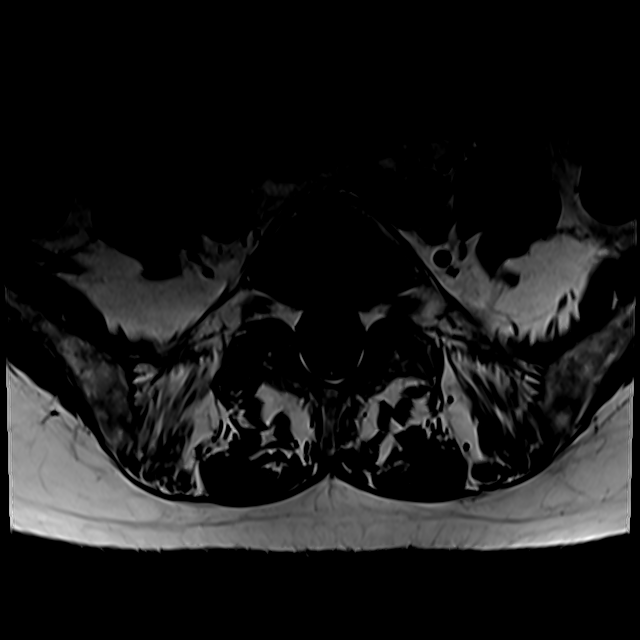
[im 21/40]
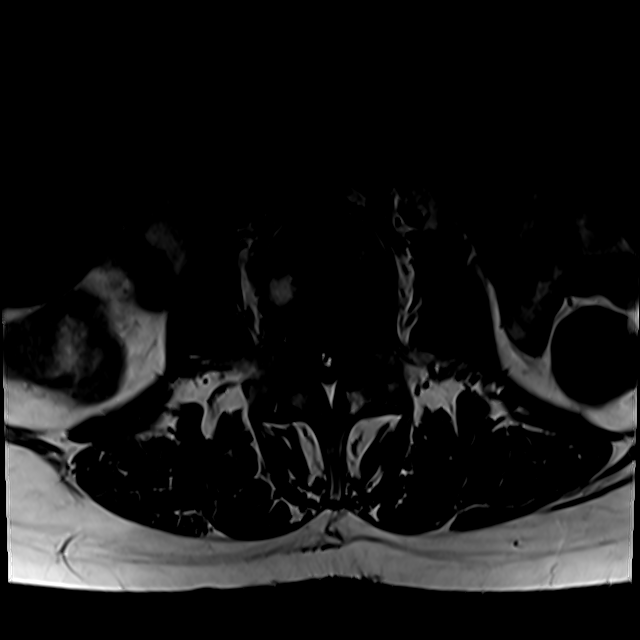
[im 34/40]
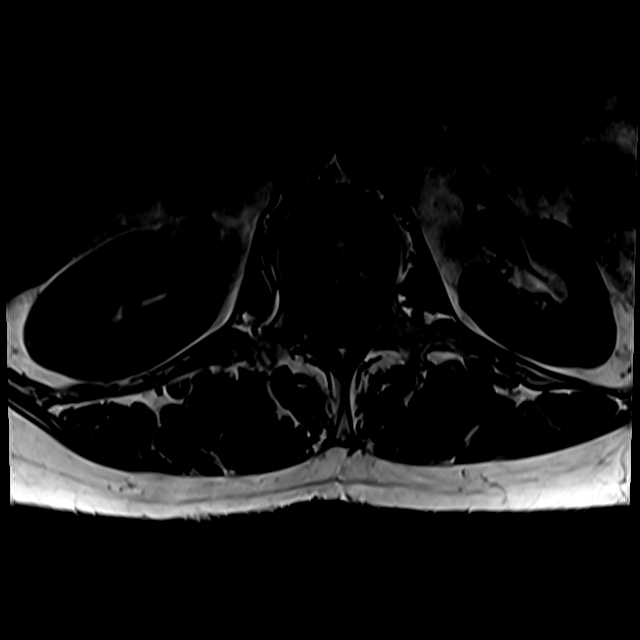

[Series 21: T2 · axial · 4.0mm · 0.28mm/px · z∈[+3,+182]mm · 7 of 40 slices shown (2 of 2)]
[im 3/40]
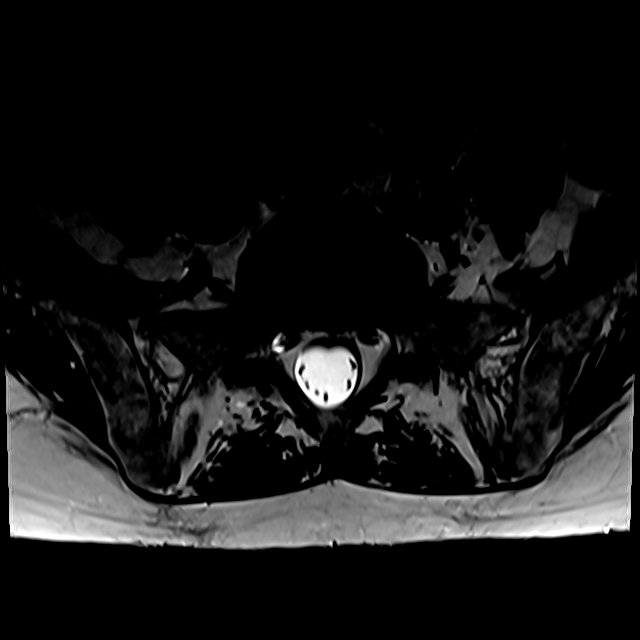
[im 6/40]
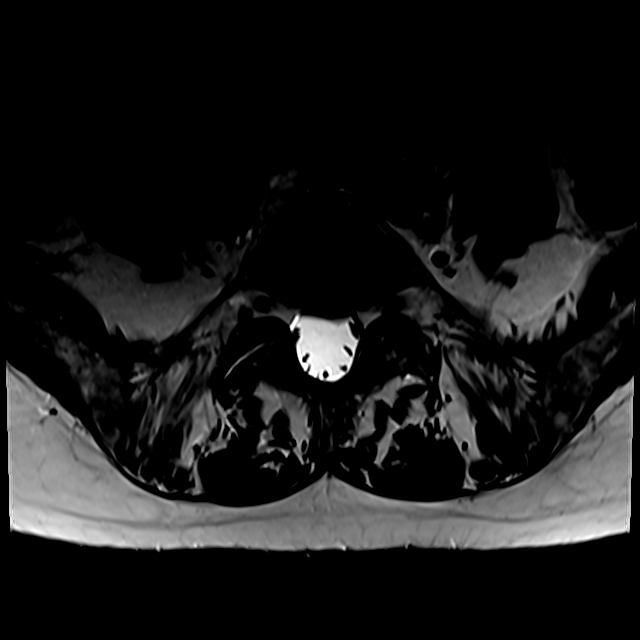
[im 8/40]
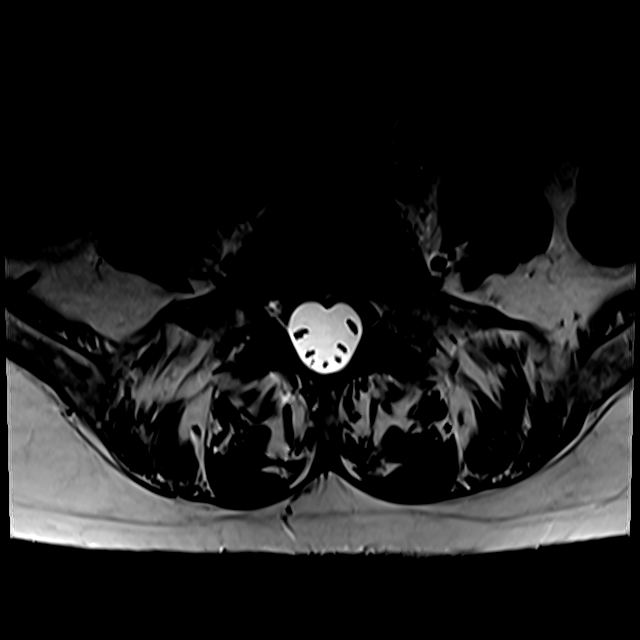
[im 14/40]
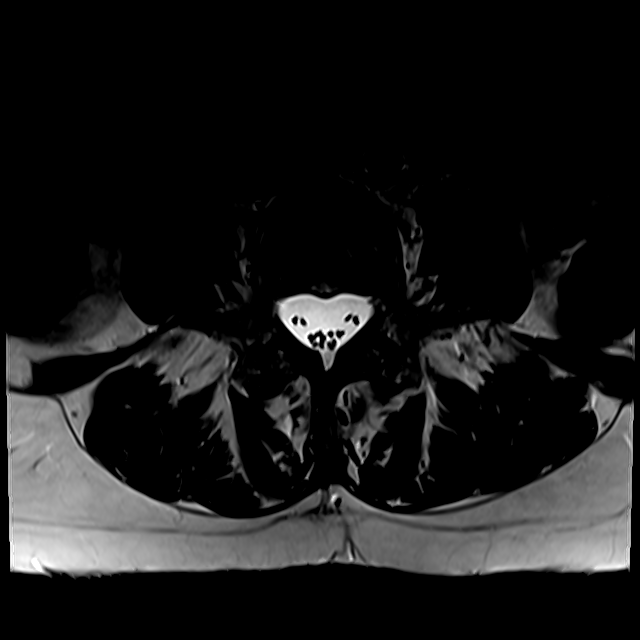
[im 19/40]
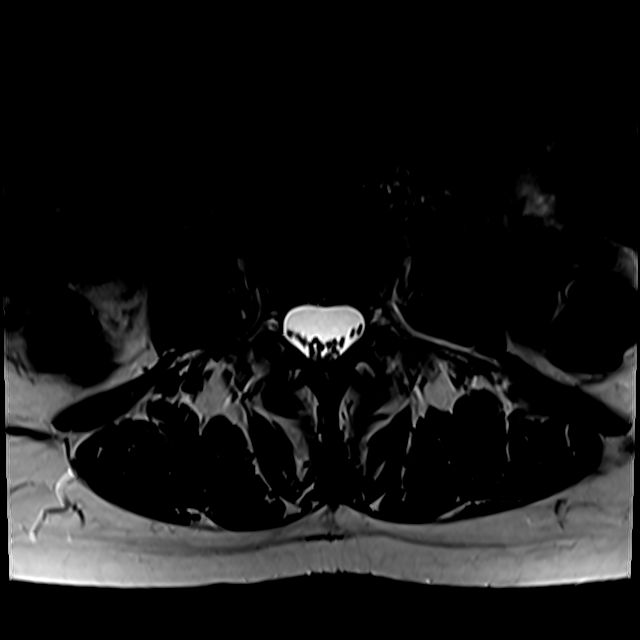
[im 21/40]
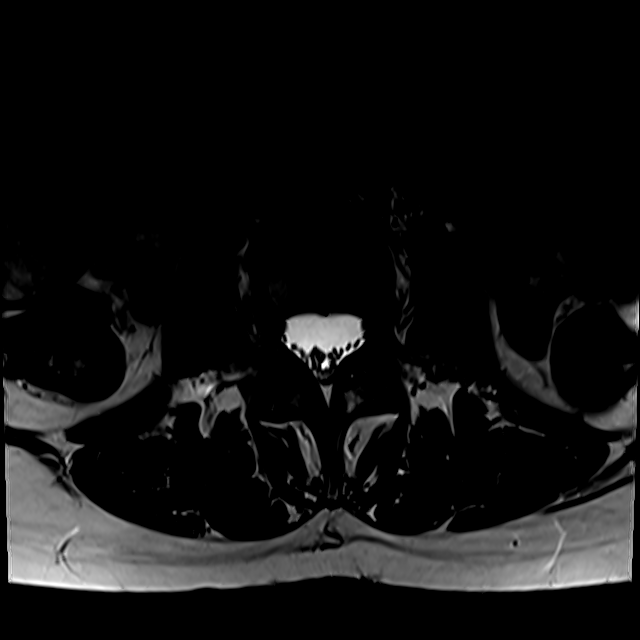
[im 34/40]
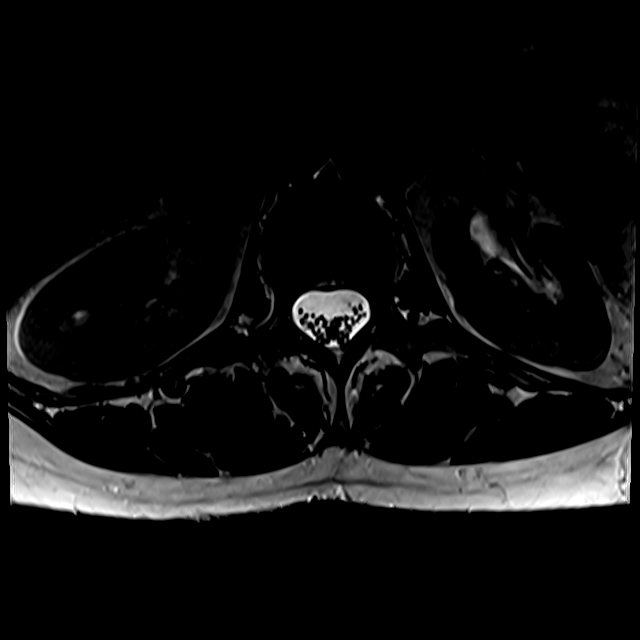

[17 of 48 positions shown; findings below may reference images not displayed]

FINDINGS: Segmentation:  Standard.

Alignment:  Trace anterolisthesis of L3 over L4.

Vertebrae:  No fracture, evidence of discitis, or bone lesion.

Conus medullaris and cauda equina: Conus extends to the L1 level.
Conus medullaris appear normal. Small filar lipoma is incidentally
noted.

Paraspinal and other soft tissues: Negative.

Disc levels:

T12-L1: No spinal canal or neural foraminal stenosis.

L1-2: No spinal canal or neural foraminal stenosis.

L1-2 shallow disc bulge and mild facet changes without significant
spinal canal or neural foraminal stenosis.

L3-4: Disc bulge, moderate hypertrophic facet degenerative changes
and mild ligamentum flavum redundancy resulting in mild spinal. No
significant neural foraminal narrowing.

In L4-5: Mild-to-moderate hypertrophic facet degenerative changes.
No spinal canal or neural foraminal stenosis.

L5-S1: Moderate hypertrophic facet degenerative changes, right
greater than left. No significant spinal canal or neural foraminal
stenosis.
IMPRESSION: Degenerative changes of the lumbar spine, more pronounced at the
level of the facet joints at L2-3, L3-4 and L4-5. No high-grade
spinal canal or neural foraminal stenosis at any level.

## 2023-03-07 ENCOUNTER — Ambulatory Visit: Payer: Medicare Other | Attending: Cardiology | Admitting: Cardiology

## 2023-03-07 VITALS — BP 98/62 | HR 71 | Ht 65.0 in | Wt 139.8 lb

## 2023-03-07 DIAGNOSIS — I480 Paroxysmal atrial fibrillation: Secondary | ICD-10-CM | POA: Insufficient documentation

## 2023-03-07 DIAGNOSIS — E785 Hyperlipidemia, unspecified: Secondary | ICD-10-CM | POA: Diagnosis present

## 2023-03-07 MED ORDER — METOPROLOL SUCCINATE ER 25 MG PO TB24: 12.5000 mg | ORAL_TABLET | Freq: Every day | ORAL | 3 refills | Status: DC

## 2023-03-07 NOTE — Progress Notes (Signed)
Cardiology Office Note:    Date:  03/07/2023   ID:  Dawn Norris, DOB December 15, 1945, MRN [id number]  PCP:  Anne Ng, NP  Cardiologist:  None  Electrophysiologist:  None   Referring MD: Anne Ng, NP   No chief complaint on file.   History of Present Illness:    Dawn Norris is a 77 y.o. female with a hx of paroxysmal atrial relation, hyperlipidemia, TIA, hypothyroidism who presents for follow-up.  She was referred by Alysia Penna, NP for evaluation of atrial fibrillation and preop evaluation, initially seen 11/06/2022.  She has had 2 known episodes of atrial fibrillation, first occurred in 2015 after administration of epinephrine during dental procedure.  Second episode occurred 09/2022 during hysteroscopy, rate was 110s to 130s per anesthesiology report.  She has upcoming hysterectomy planned 11/22/2022.  Denies any chest pain, dyspnea, lightheadedness, syncope, lower extremity edema, or palpitations.  Does not exercise due to foot pain.  Walks up stairs multiple times daily, no exertional symptoms.  Smoked from age 43 to 42, up to 1 pack per day.  Mother had rheumatic heart disease, had MI in 28s, died age 1 of MI.    Echocardiogram 11/08/2022 showed normal biventricular function, no significant valvular disease.  Zio patch x 13 days 11/2022 showed frequent PACs (7.5% of beats).  Since last clinic visit, reports she is doing well.  Denies any chest pain, dyspnea, lightheadedness, syncope, lower extremity edema, or palpitations.  No bleeding issues on Xarelto.  Reports feeling fatigued.   Past Medical History:  Diagnosis Date   History of TIA (transient ischemic attack) 01/26/2022   Thyroid disease     Past Surgical History:  Procedure Laterality Date   CYSTOCELE REPAIR     RECTOCELE REPAIR     TONSILLECTOMY      Current Medications: Current Meds  Medication Sig   estradiol (ESTRACE) 0.1 MG/GM vaginal cream SMARTSIG:Sparingly Vaginal Every Night    levothyroxine (SYNTHROID) 50 MCG tablet TAKE 1 TABLET BY MOUTH EVERY TUESDAY, THURSDAY, SATURDAY, AND SUNDAY.   levothyroxine (SYNTHROID) 75 MCG tablet TAKE 1 TABLET BY MOUTH ON MONDAY, WEDNESDAY AND FRIDAY   medroxyPROGESTERone (PROVERA) 10 MG tablet Take 10 mg by mouth daily.   Multiple Vitamin (MULTIVITAMIN WITH MINERALS) TABS tablet Take 1 tablet by mouth every morning.   Ospemifene (OSPHENA) 60 MG TABS Take 60 mg by mouth daily with breakfast.   rivaroxaban (XARELTO) 20 MG TABS tablet Take 1 tablet (20 mg total) by mouth daily with supper.   [DISCONTINUED] metoprolol succinate (TOPROL XL) 25 MG 24 hr tablet Take 1 tablet (25 mg total) by mouth daily.     Allergies:   Codeine, Epinephrine, Erythromycin, and Morphine   Social History   Socioeconomic History   Marital status: Married    Spouse name: Not on file   Number of children: Not on file   Years of education: Not on file   Highest education level: Not on file  Occupational History   Not on file  Tobacco Use   Smoking status: Never   Smokeless tobacco: Never  Vaping Use   Vaping Use: Never used  Substance and Sexual Activity   Alcohol use: Yes    Comment: A glass of wine a night   Drug use: Never   Sexual activity: Not on file  Other Topics Concern   Not on file  Social History Narrative   Not on file   Social Determinants of Health   Financial Resource Strain: Low Risk  (  10/27/2022)   Overall Financial Resource Strain (CARDIA)    Difficulty of Paying Living Expenses: Not hard at all  Food Insecurity: No Food Insecurity (10/27/2022)   Hunger Vital Sign    Worried About Running Out of Food in the Last Year: Never true    Ran Out of Food in the Last Year: Never true  Transportation Needs: No Transportation Needs (10/27/2022)   PRAPARE - Administrator, Civil Service (Medical): No    Lack of Transportation (Non-Medical): No  Physical Activity: Insufficiently Active (10/27/2022)   Exercise Vital Sign     Days of Exercise per Week: 7 days    Minutes of Exercise per Session: 20 min  Stress: No Stress Concern Present (10/27/2022)   [employer] of Occupational Health - Occupational Stress Questionnaire    Feeling of Stress : Not at all  Social Connections: Not on file     Family History: Mother had rheumatic heart disease, had MI in 18s, died age 32 of MI.    ROS:   Please see the history of present illness.     All other systems reviewed and are negative.  EKGs/Labs/Other Studies Reviewed:    The following studies were reviewed today:   EKG:   11/06/2022: Sinus rhythm, PVCs, rate 74 03/07/2023: Normal sinus rhythm with PACs, rate 71  Recent Labs: 10/25/2022: ALT 11; BUN 21; Creatinine, Ser 0.85; Hemoglobin 13.8; Platelets 282.0; Potassium 4.4; Sodium 142; TSH 3.79  Recent Lipid Panel    Component Value Date/Time   CHOL 218 (H) 10/25/2022 1009   TRIG 134.0 10/25/2022 1009   HDL 65.00 10/25/2022 1009   CHOLHDL 3 10/25/2022 1009   VLDL 26.8 10/25/2022 1009   LDLCALC 126 (H) 10/25/2022 1009    Physical Exam:    VS:  BP 98/62 (BP Location: Left Arm, Patient Position: Sitting, Cuff Size: Normal)   Pulse 71   Ht 5\' 5"  (1.651 m)   Wt 139 lb 12.8 oz (63.4 kg)   SpO2 97%   BMI 23.26 kg/m     Wt Readings from Last 3 Encounters:  03/07/23 139 lb 12.8 oz (63.4 kg)  11/06/22 136 lb (61.7 kg)  10/27/22 130 lb (59 kg)     GEN:  Well nourished, well developed in no acute distress HEENT: Normal NECK: No JVD; No carotid bruits LYMPHATICS: No lymphadenopathy CARDIAC: RRR, no murmurs, rubs, gallops RESPIRATORY:  Clear to auscultation without rales, wheezing or rhonchi  ABDOMEN: Soft, non-tender, non-distended MUSCULOSKELETAL:  No edema; No deformity  SKIN: Warm and dry NEUROLOGIC:  Alert and oriented x 3 PSYCHIATRIC:  Normal affect   ASSESSMENT:    1. Paroxysmal atrial fibrillation (HCC)   2. Hyperlipidemia, unspecified hyperlipidemia type    PLAN:     Paroxysmal  atrial fibrillation: has had 2 known episodes of atrial fibrillation, first occurred in 2015 after administration of epinephrine during dental procedure.  Second episode occurred 09/2022 during hysteroscopy, rate was 110s to 130s per anesthesiology report.  CHA2DS2-VASc score is 47 (female, age x 2); however there is also reported history of TIA, though she denies this, which would increase CHA2DS2-VASc score to 5.  Echocardiogram 11/08/2022 showed normal biventricular function, no significant valvular disease.  Zio patch x 13 days 11/2022 showed frequent PACs (7.5% of beats). -Continue Xarelto 20 mg daily.  She is unsure she wants to continue anticoagulation, recommended continuing given elevated CHADS-VASc as above.   -Discussed Kardia mobile device for further monitoring of Afib -Continue Toprol-XL, will reduce  dose to 12.5 mg daily given soft BP   Hyperlipidemia: LDL 126 on 10/25/2022.  10-year ASCVD risk score 20%.  Recommend statin but patient declines.  Discussed calcium score for further risk stratification, she will consider and let use know  RTC in 6 months   Medication Adjustments/Labs and Tests Ordered: Current medicines are reviewed at length with the patient today.  Concerns regarding medicines are outlined above.  Orders Placed This Encounter  Procedures   EKG 12-Lead   Meds ordered this encounter  Medications   metoprolol succinate (TOPROL XL) 25 MG 24 hr tablet    Sig: Take 0.5 tablets (12.5 mg total) by mouth daily.    Dispense:  45 tablet    Refill:  3    Dose change    Patient Instructions  Medication Instructions:  DECREASE metoprolol succinate (Toprol XL) to 12.5 mg daily  *If you need a refill on your cardiac medications before your next appointment, please call your pharmacy*  Testing/Procedures: CT coronary calcium score.---let us know if you would like to proceed with this test.   Test locations:  [facility]  [place]  [facility]  This is $99 out of pocket.   Coronary CalciumScan A coronary calcium scan is an imaging test used to look for deposits of calcium and other fatty materials (plaques) in the inner lining of the blood vessels of the heart (coronary arteries). These deposits of calcium and plaques can partly clog and narrow the coronary arteries without producing any symptoms or warning signs. This puts a person at risk for a heart attack. This test can detect these deposits before symptoms develop. Tell a health care provider about: Any allergies you have. All medicines you are taking, including vitamins, herbs, eye drops, creams, and over-the-counter medicines. Any problems you or family members have had with anesthetic medicines. Any blood disorders you have. Any surgeries you have had. Any medical conditions you have. Whether you are pregnant or may be pregnant. What are the risks? Generally, this is a safe procedure. However, problems may occur, including: Harm to a pregnant woman and her unborn baby. This test involves the use of radiation. Radiation exposure can be dangerous to a pregnant woman and her unborn baby. If you are pregnant, you generally should not have this procedure done. Slight increase in the risk of cancer. This is because of the radiation involved in the test. What happens before the procedure? No preparation is needed for this procedure. What happens during the procedure? You will undress and remove any jewelry around your neck or chest. You will put on a hospital gown. Sticky electrodes will be placed on your chest. The electrodes will be connected to an electrocardiogram (ECG) machine to record a tracing of the electrical activity of your heart. A CT scanner will take pictures of your heart. During this time, you will be asked to lie still and hold your breath for 2-3 seconds while a picture of your heart is being  taken. The procedure may vary among health care providers and hospitals. What happens after the procedure? You can get dressed. You can return to your normal activities. It is up to you to get the results of your test. Ask your health care provider, or the department that is doing the test, when your results will be ready. Summary A coronary calcium scan is an imaging test used to look for deposits of calcium and other fatty  materials (plaques) in the inner lining of the blood vessels of the heart (coronary arteries). Generally, this is a safe procedure. Tell your health care provider if you are pregnant or may be pregnant. No preparation is needed for this procedure. A CT scanner will take pictures of your heart. You can return to your normal activities after the scan is done. This information is not intended to replace advice given to you by your health care provider. Make sure you discuss any questions you have with your health care provider. Document Released: 03/30/2008 Document Revised: 08/21/2016 Document Reviewed: 08/21/2016 Elsevier Interactive Patient Education  2017 [employer].  Follow-Up: At [facility], you and your health needs are our priority.  As part of our continuing mission to provide you with exceptional heart care, we have created designated Provider Care Teams.  These Care Teams include your primary Cardiologist (physician) and Advanced Practice Providers (APPs -  Physician Assistants and Nurse Practitioners) who all work together to provide you with the care you need, when you need it.  We recommend signing up for the patient portal called "MyChart".  Sign up information is provided on this After Visit Summary.  MyChart is used to connect with patients for Virtual Visits (Telemedicine).  Patients are able to view lab/test results, encounter notes, upcoming appointments, etc.  Non-urgent messages can be sent to your provider as well.   To learn more about what  you can do with MyChart, go to [email or web address].    Your next appointment:   6 month(s)  Provider:   Dr. Bjorn Pippin Other Instructions You can look into the [facility] device by [employer]. This device is purchased by you and it connects to an application you download to your smart phone.  It can detect abnormal heart rhythms and alert you to contact your doctor for further evaluation. The web site is:  [email or web address]      Signed, Little Ishikawa, MD  03/07/2023 9:06 AM    [facility]

## 2023-03-07 NOTE — Patient Instructions (Signed)
Medication Instructions:  DECREASE metoprolol succinate (Toprol XL) to 12.5 mg daily  *If you need a refill on your cardiac medications before your next appointment, please call your pharmacy*  Testing/Procedures: CT coronary calcium score.---let us know if you would like to proceed with this test.   Test locations:  [facility]  [place]  This is $99 out of pocket.   Coronary CalciumScan A coronary calcium scan is an imaging test used to look for deposits of calcium and other fatty materials (plaques) in the inner lining of the blood vessels of the heart (coronary arteries). These deposits of calcium and plaques can partly clog and narrow the coronary arteries without producing any symptoms or warning signs. This puts a person at risk for a heart attack. This test can detect these deposits before symptoms develop. Tell a health care provider about: Any allergies you have. All medicines you are taking, including vitamins, herbs, eye drops, creams, and over-the-counter medicines. Any problems you or family members have had with anesthetic medicines. Any blood disorders you have. Any surgeries you have had. Any medical conditions you have. Whether you are pregnant or may be pregnant. What are the risks? Generally, this is a safe procedure. However, problems may occur, including: Harm to a pregnant woman and her unborn baby. This test involves the use of radiation. Radiation exposure can be dangerous to a pregnant woman and her unborn baby. If you are pregnant, you generally should not have this procedure done. Slight increase in the risk of cancer. This is because of the radiation involved in the test. What happens before the procedure? No preparation is needed for this procedure. What happens during the procedure? You will undress and remove any jewelry around your neck or chest. You will  put on a hospital gown. Sticky electrodes will be placed on your chest. The electrodes will be connected to an electrocardiogram (ECG) machine to record a tracing of the electrical activity of your heart. A CT scanner will take pictures of your heart. During this time, you will be asked to lie still and hold your breath for 2-3 seconds while a picture of your heart is being taken. The procedure may vary among health care providers and hospitals. What happens after the procedure? You can get dressed. You can return to your normal activities. It is up to you to get the results of your test. Ask your health care provider, or the department that is doing the test, when your results will be ready. Summary A coronary calcium scan is an imaging test used to look for deposits of calcium and other fatty materials (plaques) in the inner lining of the blood vessels of the heart (coronary arteries). Generally, this is a safe procedure. Tell your health care provider if you are pregnant or may be pregnant. No preparation is needed for this procedure. A CT scanner will take pictures of your heart. You can return to your normal activities after the scan is done. This information is not intended to replace advice given to you by your health care provider. Make sure you discuss any questions you have with your health care provider. Document Released: 03/30/2008 Document Revised: 08/21/2016 Document Reviewed: 08/21/2016 Elsevier Interactive Patient Education  2017 [employer].  Follow-Up: At [facility], you and your health needs are our priority.  As part of our continuing mission to provide you with exceptional heart care, we have created  designated Provider Care Teams.  These Care Teams include your primary Cardiologist (physician) and Advanced Practice Providers (APPs -  Physician Assistants and Nurse Practitioners) who all work together to provide you with the care you need, when you need it.  We  recommend signing up for the patient portal called "MyChart".  Sign up information is provided on this After Visit Summary.  MyChart is used to connect with patients for Virtual Visits (Telemedicine).  Patients are able to view lab/test results, encounter notes, upcoming appointments, etc.  Non-urgent messages can be sent to your provider as well.   To learn more about what you can do with MyChart, go to [email or web address].    Your next appointment:   6 month(s)  Provider:   Dr. Bjorn Pippin Other Instructions You can look into the [facility] device by [employer]. This device is purchased by you and it connects to an application you download to your smart phone.  It can detect abnormal heart rhythms and alert you to contact your doctor for further evaluation. The web site is:  [email or web address].alivecor.[email or web address]

## 2023-04-30 ENCOUNTER — Other Ambulatory Visit: Payer: Self-pay | Admitting: Nurse Practitioner

## 2023-04-30 DIAGNOSIS — E039 Hypothyroidism, unspecified: Secondary | ICD-10-CM

## 2023-05-01 ENCOUNTER — Other Ambulatory Visit: Payer: Self-pay | Admitting: Nurse Practitioner

## 2023-05-01 ENCOUNTER — Telehealth: Payer: Self-pay | Admitting: Nurse Practitioner

## 2023-05-01 ENCOUNTER — Other Ambulatory Visit: Payer: Self-pay

## 2023-05-01 DIAGNOSIS — E039 Hypothyroidism, unspecified: Secondary | ICD-10-CM

## 2023-05-01 MED ORDER — LEVOTHYROXINE SODIUM 75 MCG PO TABS: ORAL_TABLET | ORAL | 0 refills | Status: DC

## 2023-05-01 MED ORDER — LEVOTHYROXINE SODIUM 50 MCG PO TABS
ORAL_TABLET | ORAL | 0 refills | Status: DC
Start: 2023-05-01 — End: 2023-05-17

## 2023-05-01 NOTE — Telephone Encounter (Signed)
Called patient and made her aware of Charlotte's note from January's visit, to follow up in July.   Her medication has been sent to her pharmacy and an appointment for scheduled for 8/1

## 2023-05-01 NOTE — Telephone Encounter (Signed)
[facility]on[facility] [phone]  Pt called asking to make an appt for labs only to check her thyroid. I was told she needed an OV. When I expressed this she stated.. I am not doing that. I will not come in.My husband is going through some health issues and I'm not coming in. She needs to just send it in but I am willing to come do the labs. I expressed that she had not been seen since January, she stated that it's only July. She stated that if she doesn't call it in I will just go without the medicine. I told her I would pass along this information.

## 2023-05-17 ENCOUNTER — Encounter: Payer: Self-pay | Admitting: Nurse Practitioner

## 2023-05-17 ENCOUNTER — Ambulatory Visit (INDEPENDENT_AMBULATORY_CARE_PROVIDER_SITE_OTHER): Payer: Medicare Other | Admitting: Nurse Practitioner

## 2023-05-17 VITALS — BP 110/64 | HR 62 | Temp 97.6°F | Resp 16 | Ht 65.0 in | Wt 140.6 lb

## 2023-05-17 DIAGNOSIS — E039 Hypothyroidism, unspecified: Secondary | ICD-10-CM

## 2023-05-17 DIAGNOSIS — E7800 Pure hypercholesterolemia, unspecified: Secondary | ICD-10-CM | POA: Diagnosis not present

## 2023-05-17 LAB — LIPID PANEL
Cholesterol: 252 mg/dL — ABNORMAL HIGH (ref 0–200)
HDL: 72.1 mg/dL (ref >39.00)
LDL Cholesterol: 156 mg/dL — ABNORMAL HIGH (ref 0–99)
NonHDL: 179.85
Total CHOL/HDL Ratio: 3
Triglycerides: 121 mg/dL (ref 0.0–149.0)
VLDL: 24.2 mg/dL (ref 0.0–40.0)

## 2023-05-17 LAB — T4, FREE: Free T4: 0.93 ng/dL (ref 0.60–1.60)

## 2023-05-17 LAB — TSH: TSH: 3.89 u[IU]/mL (ref 0.35–5.50)

## 2023-05-17 MED ORDER — ROSUVASTATIN CALCIUM 10 MG PO TABS
10.0000 mg | ORAL_TABLET | Freq: Every day | ORAL | 3 refills | Status: DC
Start: 2023-05-17 — End: 2023-11-14

## 2023-05-17 MED ORDER — LEVOTHYROXINE SODIUM 50 MCG PO TABS
ORAL_TABLET | ORAL | 1 refills | Status: AC
Start: 2023-05-17 — End: ?

## 2023-05-17 MED ORDER — LEVOTHYROXINE SODIUM 75 MCG PO TABS
ORAL_TABLET | ORAL | 1 refills | Status: AC
Start: 2023-05-17 — End: ?

## 2023-05-17 NOTE — Assessment & Plan Note (Addendum)
She agreed to start statin drug if repeat lipid panel remains elevated. Current ASCVD risk at 13.7% Lipid Panel     Component Value Date/Time   CHOL 218 (H) 10/25/2022 1009   TRIG 134.0 10/25/2022 1009   HDL 65.00 10/25/2022 1009   CHOLHDL 3 10/25/2022 1009   VLDL 26.8 10/25/2022 1009   LDLCALC 126 (H) 10/25/2022 1009     Repeat lipid panel

## 2023-05-17 NOTE — Progress Notes (Signed)
Established Patient Visit  Patient: Dawn Norris   DOB: 08/06/1946   77 y.o. Female  MRN: [id number] Visit Date: 05/17/2023  Subjective:    Chief Complaint  Patient presents with   Hypothyroidism   HPI Hypothyroidism Repeat Tsh and T4 Wt Readings from Last 3 Encounters:  05/17/23 140 lb 9.6 oz (63.8 kg)  03/07/23 139 lb 12.8 oz (63.4 kg)  11/06/22 136 lb (61.7 kg)    BP Readings from Last 3 Encounters:  05/17/23 110/64  03/07/23 98/62  11/06/22 138/80    Maintain med dose while waiting for lab results  Pure hypercholesterolemia She agreed to start statin drug if repeat lipid panel remains elevated. Current ASCVD risk at 13.7% Lipid Panel     Component Value Date/Time   CHOL 218 (H) 10/25/2022 1009   TRIG 134.0 10/25/2022 1009   HDL 65.00 10/25/2022 1009   CHOLHDL 3 10/25/2022 1009   VLDL 26.8 10/25/2022 1009   LDLCALC 126 (H) 10/25/2022 1009     Repeat lipid panel  Reviewed medical, surgical, and social history today  Medications: Outpatient Medications Prior to Visit  Medication Sig   estradiol (ESTRACE) 0.1 MG/GM vaginal cream SMARTSIG:Sparingly Vaginal Every Night   ibuprofen (ADVIL) 200 MG tablet Take 200 mg by mouth 3 (three) times daily as needed (pain).   levothyroxine (SYNTHROID) 50 MCG tablet TAKE 1 TABLET BY MOUTH EVERY TUESDAY, THURSDAY, SATURDAY, AND SUNDAY.   levothyroxine (SYNTHROID) 75 MCG tablet TAKE 1 TABLET BY MOUTH ON MONDAY, WEDNESDAY AND FRIDAY   medroxyPROGESTERone (PROVERA) 10 MG tablet Take 10 mg by mouth daily.   metoprolol succinate (TOPROL XL) 25 MG 24 hr tablet Take 0.5 tablets (12.5 mg total) by mouth daily.   Multiple Vitamin (MULTIVITAMIN WITH MINERALS) TABS tablet Take 1 tablet by mouth every morning.   Ospemifene (OSPHENA) 60 MG TABS Take 60 mg by mouth daily with breakfast.   rivaroxaban (XARELTO) 20 MG TABS tablet Take 1 tablet (20 mg total) by mouth daily with supper.   No facility-administered medications  prior to visit.   Reviewed past medical and social history.   ROS per HPI above  Last metabolic panel Lab Results  Component Value Date   GLUCOSE 91 10/25/2022   NA 142 10/25/2022   K 4.4 10/25/2022   CL 103 10/25/2022   CO2 32 10/25/2022   BUN 21 10/25/2022   CREATININE 0.85 10/25/2022   GFR 66.63 10/25/2022   CALCIUM 9.5 10/25/2022   PROT 6.7 10/25/2022   ALBUMIN 4.1 10/25/2022   BILITOT 0.4 10/25/2022   ALKPHOS 46 10/25/2022   AST 16 10/25/2022   ALT 11 10/25/2022   ANIONGAP 8 01/21/2022   Last lipids Lab Results  Component Value Date   CHOL 218 (H) 10/25/2022   HDL 65.00 10/25/2022   LDLCALC 126 (H) 10/25/2022   TRIG 134.0 10/25/2022   CHOLHDL 3 10/25/2022   Last thyroid functions Lab Results  Component Value Date   TSH 3.79 10/25/2022        Objective:  BP 110/64 (BP Location: Left Arm, Patient Position: Sitting, Cuff Size: Normal)   Pulse 62   Temp 97.6 F (36.4 C) (Temporal)   Resp 16   Ht 5\' 5"  (1.651 m)   Wt 140 lb 9.6 oz (63.8 kg)   SpO2 98%   BMI 23.40 kg/m      Physical Exam Cardiovascular:     Rate and Rhythm: Normal  rate and regular rhythm.     Pulses: Normal pulses.     Heart sounds: Normal heart sounds.  Pulmonary:     Effort: Pulmonary effort is normal.     Breath sounds: Normal breath sounds.  Neurological:     Mental Status: She is alert and oriented to person, place, and time.     No results found for any visits on 05/17/23.    Assessment & Plan:    Problem List Items Addressed This Visit       Endocrine   Hypothyroidism - Primary    Repeat Tsh and T4 Wt Readings from Last 3 Encounters:  05/17/23 140 lb 9.6 oz (63.8 kg)  03/07/23 139 lb 12.8 oz (63.4 kg)  11/06/22 136 lb (61.7 kg)    BP Readings from Last 3 Encounters:  05/17/23 110/64  03/07/23 98/62  11/06/22 138/80    Maintain med dose while waiting for lab results      Relevant Orders   T4, free   TSH     Other   Pure hypercholesterolemia    She  agreed to start statin drug if repeat lipid panel remains elevated. Current ASCVD risk at 13.7% Lipid Panel     Component Value Date/Time   CHOL 218 (H) 10/25/2022 1009   TRIG 134.0 10/25/2022 1009   HDL 65.00 10/25/2022 1009   CHOLHDL 3 10/25/2022 1009   VLDL 26.8 10/25/2022 1009   LDLCALC 126 (H) 10/25/2022 1009     Repeat lipid panel      Relevant Orders   Lipid panel   Return in about 6 months (around 11/17/2023) for Hypothyroidism, hyperlipidemia (fasting).     Alysia Penna, NP

## 2023-05-17 NOTE — Assessment & Plan Note (Signed)
Repeat Tsh and T4 Wt Readings from Last 3 Encounters:  05/17/23 140 lb 9.6 oz (63.8 kg)  03/07/23 139 lb 12.8 oz (63.4 kg)  11/06/22 136 lb (61.7 kg)    BP Readings from Last 3 Encounters:  05/17/23 110/64  03/07/23 98/62  11/06/22 138/80    Maintain med dose while waiting for lab results

## 2023-05-17 NOTE — Patient Instructions (Signed)
Go to lab Continue Heart healthy diet and daily exercise. Maintain current medications. 

## 2023-05-17 NOTE — Addendum Note (Signed)
Addended by: Michaela Corner on: 05/17/2023 03:58 PM   Modules accepted: Orders

## 2023-05-18 ENCOUNTER — Telehealth: Payer: Self-pay

## 2023-05-18 NOTE — Telephone Encounter (Signed)
Called patient and made her aware of change

## 2023-05-18 NOTE — Telephone Encounter (Signed)
-----   Message from [place] sent at 05/18/2023 10:18 AM EDT ----- Regarding: FW: Tranfer of care  Please inform Ms. Petruzzi about Dr. Gershon Crane response below.  CN ----- Message ----- From: Regan Lemming, MD Sent: 05/18/2023   9:52 AM EDT To: Baird Lyons, RN; Anne Ng, NP Subject: RE: Tranfer of care                            I will forward to my nurse and see what we can do to arrange. Thanks. ----- Message ----- From: Anne Ng, NP Sent: 05/17/2023  10:07 AM EDT To: Will Jorja Loa, MD Subject: Tranfer of care                                Good Morning Dr. Elberta Fortis, Mrs. Lemire is wife of Mr. Betha Loa, who is your patient.  She was really impressed with your care, so she will like to transfer her care to you. She is a current patient of Dr. Bjorn Pippin. Please let me know if this will be possible.  Thank you  Alysia Penna, NP

## 2023-05-25 ENCOUNTER — Telehealth: Payer: Self-pay | Admitting: Cardiology

## 2023-05-25 ENCOUNTER — Encounter: Payer: Self-pay | Admitting: Nurse Practitioner

## 2023-05-25 NOTE — Telephone Encounter (Signed)
OK with me Chris  

## 2023-05-25 NOTE — Telephone Encounter (Signed)
  Patient is requesting to switch from Dr. Bjorn Pippin to Dr. Colon Flattery. Please see notes from patient's pcp;   ----- Message from Alysia Penna sent at 05/18/2023 10:18 AM EDT ----- Regarding: FW: Tranfer of care  Please inform Ms. Biddle about Dr. Gershon Crane response below.   CN ----- Message ----- From: Regan Lemming, MD Sent: 05/18/2023   9:52 AM EDT To: Baird Lyons, RN; Anne Ng, NP Subject: RE: Tranfer of care                             I will forward to my nurse and see what we can do to arrange. Thanks. ----- Message ----- From: Anne Ng, NP Sent: 05/17/2023  10:07 AM EDT To: Will Jorja Loa, MD Subject: Tranfer of care                                 Good Morning Dr. Elberta Fortis, Mrs. Benton is wife of Mr. Betha Loa, who is your patient.  She was really impressed with your care, so she will like to transfer her care to you. She is a current patient of Dr. Bjorn Pippin. Please let me know if this will be possible.   Thank you   Alysia Penna, NP

## 2023-05-28 ENCOUNTER — Telehealth: Payer: Self-pay | Admitting: Cardiology

## 2023-05-28 NOTE — Telephone Encounter (Signed)
Patient would like to switch from Dr. Bjorn Pippin to Dr. Wyline Mood.  Are you both in agreement with this?

## 2023-05-29 NOTE — Telephone Encounter (Signed)
OK with me Dawn Norris  

## 2023-07-16 NOTE — Telephone Encounter (Signed)
Patient scheduled for 11/18 with Dr. Wyline Mood.

## 2023-07-18 ENCOUNTER — Encounter: Payer: Self-pay | Admitting: Nurse Practitioner

## 2023-07-18 DIAGNOSIS — E039 Hypothyroidism, unspecified: Secondary | ICD-10-CM

## 2023-07-18 MED ORDER — LEVOTHYROXINE SODIUM 50 MCG PO TABS: ORAL_TABLET | ORAL | 1 refills | Status: DC

## 2023-07-18 MED ORDER — LEVOTHYROXINE SODIUM 75 MCG PO TABS
ORAL_TABLET | ORAL | 1 refills | Status: DC
Start: 2023-07-18 — End: 2023-10-25

## 2023-07-19 NOTE — Progress Notes (Signed)
Electrophysiology Office Note:   Date:  07/20/2023  ID:  Dawn Norris, DOB April 03, 1946, MRN [id number]  Primary Cardiologist: Little Ishikawa, MD Electrophysiologist: Lesly Joslyn Jorja Loa, MD      History of Present Illness:   Dawn Norris is a 77 y.o. female with h/o atrial fibrillation, hypertension, TIA, hypothyroidism seen today for  for Electrophysiology evaluation of atrial fibrillation at the request of Nathaniel Man.    She has had 2 episodes of atrial fibrillation, initially in 2015 during a dental procedure and a 16 September 2022 during hysteroscopy.  Heart rates are in the 110s to 130s.  Echo showed normal systolic function.  Cardiac monitor showed PACs but no atrial fibrillation.  Discussed the use of AI scribe software for clinical note transcription with the patient, who gave verbal consent to proceed.  History of Present Illness   The patient, with a history of AFib and PACs, presents for a follow-up visit. She reports no symptoms and is currently on multiple medications, including metoprolol and Xarelto. She expresses dissatisfaction with her current cardiologist and is in the process of switching. She went into atrial fibrillation after a D&C for a short time. She has possibly had one other episode of atrial fibrillation which was short lived. She has been asked to wear a heart monitor, which revealed PACs. She also reports high cholesterol and is now on a statin. She expresses a strong desire to avoid blood thinners, despite understanding the potential risk of clots. She is also concerned about her quality of life and expresses a desire to live on her own terms. She is due to turn 77 in 11 days and has a family history of heart disease. She also expresses a strong desire to outlive her partner, Dawn Norris, who is currently in remission from mantle cell lymphoma.       Review of systems complete and found to be negative unless listed in HPI.   EP Information / Studies  Reviewed:    EKG is ordered today. Personal review as below.  EKG Interpretation Date/Time:  Friday July 20 2023 09:00:28 EDT Ventricular Rate:  74 PR Interval:  136 QRS Duration:  76 QT Interval:  378 QTC Calculation: 419 R Axis:   48  Text Interpretation: Sinus rhythm with Premature supraventricular complexes When compared with ECG of 21-Jan-2022 15:07, No significant change since last tracing Confirmed by Knox Cervi ([id number]) on 07/20/2023 9:08:17 AM     Risk Assessment/Calculations:    CHA2DS2-VASc Score = 3   This indicates a 3.2% annual risk of stroke. The patient's score is based upon: CHF History: 0 HTN History: 0 Diabetes History: 0 Stroke History: 0 Vascular Disease History: 0 Age Score: 2 Gender Score: 1             Physical Exam:   VS:  BP 130/84 (BP Location: Left Arm, Patient Position: Sitting, Cuff Size: Normal)   Pulse 74   Ht 5\' 5"  (1.651 m)   Wt 140 lb (63.5 kg)   SpO2 97%   BMI 23.30 kg/m    Wt Readings from Last 3 Encounters:  07/20/23 140 lb (63.5 kg)  05/17/23 140 lb 9.6 oz (63.8 kg)  03/07/23 139 lb 12.8 oz (63.4 kg)     GEN: Well nourished, well developed in no acute distress NECK: No JVD; No carotid bruits CARDIAC: Regular rate and rhythm, no murmurs, rubs, gallops RESPIRATORY:  Clear to auscultation without rales, wheezing or rhonchi  ABDOMEN: Soft, non-tender, non-distended EXTREMITIES:  No edema;  No deformity   ASSESSMENT AND PLAN:       Atrial Fibrillation History of brief episode of AFib during anesthesia for D&C approximately a year ago. No symptoms of AFib currently. Patient has a strong preference against anticoagulation. Discussed the risk of stroke with AFib and the role of anticoagulation in preventing stroke. Calculated CHA2DS2-VASc score is 2 (age >48, female), which typically indicates a need for anticoagulation. However, given the patient's strong preference and the brief, possibly isolated nature of her AFib, a  decision was made to discontinue anticoagulation. -Discontinue Xarelto. -Consider home heart rhythm monitoring with Cardio Mobile or Apple Watch to detect any further episodes of AFib.  Premature Atrial Contractions (PACs) Noted on heart monitor. No symptoms. Discussed that PACs can sometimes precede the development of AFib. -No specific treatment needed at this time.  Hyperlipidemia Elevated cholesterol levels. Patient agrees to continue statin therapy. -Continue statin therapy.  Metoprolol side effects Patient reports feeling tired on metoprolol, with blood pressure dropping below 60. -Discontinue metoprolol.  Follow-up in 6 months to reassess.        Follow up with Dr. Elberta Fortis in 6 months  Signed, Chelsa Stout Jorja Loa, MD

## 2023-07-20 ENCOUNTER — Encounter: Payer: Self-pay | Admitting: Cardiology

## 2023-07-20 ENCOUNTER — Ambulatory Visit: Payer: Medicare Other | Attending: Cardiology | Admitting: Cardiology

## 2023-07-20 VITALS — BP 130/84 | HR 74 | Ht 65.0 in | Wt 140.0 lb

## 2023-07-20 DIAGNOSIS — I480 Paroxysmal atrial fibrillation: Secondary | ICD-10-CM | POA: Insufficient documentation

## 2023-07-20 DIAGNOSIS — D6869 Other thrombophilia: Secondary | ICD-10-CM | POA: Diagnosis not present

## 2023-07-20 NOTE — Patient Instructions (Signed)
Medication Instructions:  Your physician has recommended you make the following change in your medication: STOP Xarelto STOP Metoprolol Succinate (Toprol)  *If you need a refill on your cardiac medications before your next appointment, please call your pharmacy*   Lab Work: None ordered If you have labs (blood work) drawn today and your tests are completely normal, you will receive your results only by: MyChart Message (if you have MyChart) OR A paper copy in the mail If you have any lab test that is abnormal or we need to change your treatment, we will call you to review the results.   Testing/Procedures: None ordered   Follow-Up: At [facility], you and your health needs are our priority.  As part of our continuing mission to provide you with exceptional heart care, we have created designated Provider Care Teams.  These Care Teams include your primary Cardiologist (physician) and Advanced Practice Providers (APPs -  Physician Assistants and Nurse Practitioners) who all work together to provide you with the care you need, when you need it.  Your next appointment:   6 month(s)  The format for your next appointment:   In Person  Provider:   Loman Brooklyn, MD    Thank you for choosing [facility]!!   Dory Horn, RN [phone]

## 2023-08-13 ENCOUNTER — Encounter: Payer: Self-pay | Admitting: Nurse Practitioner

## 2023-09-03 ENCOUNTER — Ambulatory Visit: Payer: Medicare Other | Admitting: Internal Medicine

## 2023-09-05 ENCOUNTER — Encounter: Payer: Self-pay | Admitting: Nurse Practitioner

## 2023-09-05 ENCOUNTER — Ambulatory Visit (INDEPENDENT_AMBULATORY_CARE_PROVIDER_SITE_OTHER): Payer: Medicare Other | Admitting: Nurse Practitioner

## 2023-09-05 VITALS — BP 116/71 | HR 70 | Temp 98.2°F | Resp 18 | Wt 136.8 lb

## 2023-09-05 DIAGNOSIS — R102 Pelvic and perineal pain unspecified side: Secondary | ICD-10-CM | POA: Insufficient documentation

## 2023-09-05 DIAGNOSIS — E7800 Pure hypercholesterolemia, unspecified: Secondary | ICD-10-CM | POA: Diagnosis not present

## 2023-09-05 LAB — LIPID PANEL
Cholesterol: 165 mg/dL (ref 0–200)
HDL: 61.3 mg/dL (ref >39.00)
LDL Cholesterol: 89 mg/dL (ref 0–99)
NonHDL: 103.88
Total CHOL/HDL Ratio: 3
Triglycerides: 75 mg/dL (ref 0.0–149.0)
VLDL: 15 mg/dL (ref 0.0–40.0)

## 2023-09-05 NOTE — Patient Instructions (Signed)
Go to lab

## 2023-09-05 NOTE — Progress Notes (Signed)
Established Patient Visit  Patient: Dawn Norris   DOB: September 14, 1946   77 y.o. Female  MRN: [id number] Visit Date: 09/05/2023  Subjective:    Chief Complaint  Patient presents with   OFFICE VISIT    Hyperlipidemia   Hypothyroidism   HPI Pelvic pressure in female Hx of cystocele and rectocele repair in 2007 reports vaginal and rectal pressure when sitting and walking. Difficulty with bowel movement but no constipation or diarrhea or rectal pain-needs to strain with each BM. Not sexually active due to pain No urinary difficulty, no obvious protrusion or mass in rectal or vaginal record per patient. She agreed to urogyn referral  Pure hypercholesterolemia No adverse effects with crestor 10mg  Repeat lipid panel: improved Maintain med dose  Reviewed medical, surgical, and social history today  Medications: Outpatient Medications Prior to Visit  Medication Sig   estradiol (ESTRACE) 0.1 MG/GM vaginal cream SMARTSIG:Sparingly Vaginal Every Night   ibuprofen (ADVIL) 200 MG tablet Take 200 mg by mouth 3 (three) times daily as needed (pain).   levothyroxine (SYNTHROID) 50 MCG tablet TAKE 1 TABLET BY MOUTH EVERY TUESDAY, THURSDAY, SATURDAY, AND SUNDAY.   levothyroxine (SYNTHROID) 75 MCG tablet TAKE 1 TABLET BY MOUTH ON MONDAY, WEDNESDAY AND FRIDAY   Multiple Vitamin (MULTIVITAMIN WITH MINERALS) TABS tablet Take 1 tablet by mouth every morning.   Ospemifene (OSPHENA) 60 MG TABS Take 60 mg by mouth daily with breakfast.   rosuvastatin (CRESTOR) 10 MG tablet Take 1 tablet (10 mg total) by mouth at bedtime.   [DISCONTINUED] medroxyPROGESTERone (PROVERA) 10 MG tablet Take 10 mg by mouth daily.   No facility-administered medications prior to visit.   Reviewed past medical and social history.   ROS per HPI above      Objective:  BP 116/71 (BP Location: Left Arm, Patient Position: Sitting, Cuff Size: Normal)   Pulse 70   Temp 98.2 F (36.8 C) (Temporal)   Resp 18   Wt  136 lb 12.8 oz (62.1 kg)   SpO2 97%   BMI 22.76 kg/m      Physical Exam Vitals and nursing note reviewed.  Cardiovascular:     Rate and Rhythm: Normal rate.     Pulses: Normal pulses.  Pulmonary:     Effort: Pulmonary effort is normal.  Genitourinary:    Comments: Declined pelvic exam Neurological:     Mental Status: She is alert and oriented to person, place, and time.     Results for orders placed or performed in visit on 09/05/23  Lipid panel  Result Value Ref Range   Cholesterol 165 0 - 200 mg/dL   Triglycerides 56.2 0.0 - 149.0 mg/dL   HDL 13.08 >65.78 mg/dL   VLDL 46.9 0.0 - 62.9 mg/dL   LDL Cholesterol 89 0 - 99 mg/dL   Total CHOL/HDL Ratio 3    NonHDL 103.88       Assessment & Plan:    Problem List Items Addressed This Visit     Pelvic pressure in female - Primary    Hx of cystocele and rectocele repair in 2007 reports vaginal and rectal pressure when sitting and walking. Difficulty with bowel movement but no constipation or diarrhea or rectal pain-needs to strain with each BM. Not sexually active due to pain No urinary difficulty, no obvious protrusion or mass in rectal or vaginal record per patient. She agreed to urogyn referral      Relevant Orders  Ambulatory referral to Urogynecology   Pure hypercholesterolemia    No adverse effects with crestor 10mg  Repeat lipid panel: improved Maintain med dose      Relevant Orders   Lipid panel (Completed)   Return in about 6 months (around 03/04/2024) for Osteoporosis, Hypothyroidism, hyperlipidemia (fasting).     Alysia Penna, NP

## 2023-09-05 NOTE — Assessment & Plan Note (Signed)
Hx of cystocele and rectocele repair in 2007 reports vaginal and rectal pressure when sitting and walking. Difficulty with bowel movement but no constipation or diarrhea or rectal pain-needs to strain with each BM. Not sexually active due to pain No urinary difficulty, no obvious protrusion or mass in rectal or vaginal record per patient. She agreed to urogyn referral

## 2023-09-05 NOTE — Assessment & Plan Note (Signed)
No adverse effects with crestor 10mg  Repeat lipid panel: improved Maintain med dose

## 2023-09-16 ENCOUNTER — Encounter: Payer: Self-pay | Admitting: Nurse Practitioner

## 2023-10-22 ENCOUNTER — Telehealth: Payer: Self-pay

## 2023-10-24 NOTE — Telephone Encounter (Signed)
 Error

## 2023-10-25 ENCOUNTER — Other Ambulatory Visit: Payer: Self-pay

## 2023-10-25 ENCOUNTER — Telehealth: Payer: Self-pay

## 2023-10-25 DIAGNOSIS — E039 Hypothyroidism, unspecified: Secondary | ICD-10-CM

## 2023-10-25 MED ORDER — LEVOTHYROXINE SODIUM 75 MCG PO TABS: ORAL_TABLET | ORAL | 1 refills | Status: DC

## 2023-10-25 MED ORDER — LEVOTHYROXINE SODIUM 50 MCG PO TABS
ORAL_TABLET | ORAL | 1 refills | Status: DC
Start: 2023-10-25 — End: 2024-03-17

## 2023-10-25 MED ORDER — OSPHENA 60 MG PO TABS: 60.0000 mg | ORAL_TABLET | Freq: Every day | ORAL | 0 refills | Status: DC

## 2023-10-25 NOTE — Telephone Encounter (Signed)
 Copied from CRM [id number]. Topic: General - Other >> Oct 25, 2023  2:46 PM Antonio H wrote: Reason for CRM: Patient recently spoke with someone in the back but could not remember their name. Called [facility] to get patient back connected and was told it Rosina she spoke with but she wasn't available. Patient wants to let her know that after their conversation, she called [facility] to cancel prescriptions since they were being sent to [facility] and they let her know they have not had anything on file since October despite being told that her medications were sent there. Patient is upset and saying she needs her medications and this needs to be figured out.

## 2023-10-25 NOTE — Telephone Encounter (Signed)
 Please see below.

## 2023-10-25 NOTE — Telephone Encounter (Signed)
 Spoke to patient and she had concerns regarding RX refills that was suppose to be sent on Monday after speaking with nurse (unsure who). Patient stated pharmacy is [facility] and not [facility] and demanded medication to be sent. I did offer an apology to patient for the inconvenience and delay with medications. Rx was sent to correct location as of 10/25/2023.

## 2023-10-26 NOTE — Telephone Encounter (Signed)
 Rx was sent to [facility] as directed and also change in chart

## 2023-10-29 ENCOUNTER — Ambulatory Visit (INDEPENDENT_AMBULATORY_CARE_PROVIDER_SITE_OTHER): Payer: Medicare Other

## 2023-10-29 DIAGNOSIS — Z0000 Encounter for general adult medical examination without abnormal findings: Secondary | ICD-10-CM

## 2023-10-29 NOTE — Progress Notes (Signed)
 Subjective:   Dawn Norris is a 78 y.o. female who presents for Medicare Annual (Subsequent) preventive examination.  Visit Complete: Virtual I connected with  Dawn Norris on 10/29/23 by a audio enabled telemedicine application and verified that I am speaking with the correct person using two identifiers.  This patient declined Interactive audio and acupuncturist. Therefore the visit was completed with audio only.  Patient Location: Home  Provider Location: Office/Clinic  I discussed the limitations of evaluation and management by telemedicine. The patient expressed understanding and agreed to proceed.  Vital Signs: Because this visit was a virtual/telehealth visit, some criteria may be missing or patient reported. Any vitals not documented were not able to be obtained and vitals that have been documented are patient reported.  Patient Medicare AWV questionnaire was completed by the patient on 10/25/2023; I have confirmed that all information answered by patient is correct and no changes since this date.  Cardiac Risk Factors include: advanced age (>12men, >30 women)     Objective:    Today's Vitals   There is no height or weight on file to calculate BMI.     10/29/2023    4:19 PM 10/27/2022    2:58 PM 10/10/2022   11:14 AM 01/21/2022    2:15 PM  Advanced Directives  Does Patient Have a Medical Advance Directive? Yes Yes Yes No  Type of Estate Agent of [place];Living will Healthcare Power of [place];Living will Healthcare Power of [place];Living will   Copy of Healthcare Power of Attorney in Chart? No - copy requested No - copy requested      Current Medications (verified) Outpatient Encounter Medications as of 10/29/2023  Medication Sig   estradiol  (ESTRACE ) 0.1 MG/GM vaginal cream SMARTSIG:Sparingly Vaginal Every Night   ibuprofen (ADVIL) 200 MG tablet Take 200 mg by mouth 3 (three) times daily as needed (pain).   levothyroxine   (SYNTHROID ) 50 MCG tablet TAKE 1 TABLET BY MOUTH EVERY TUESDAY, THURSDAY, SATURDAY, AND SUNDAY.   levothyroxine  (SYNTHROID ) 75 MCG tablet TAKE 1 TABLET BY MOUTH ON MONDAY, WEDNESDAY AND FRIDAY   Multiple Vitamin (MULTIVITAMIN WITH MINERALS) TABS tablet Take 1 tablet by mouth every morning.   Ospemifene  (OSPHENA ) 60 MG TABS Take 1 tablet (60 mg total) by mouth daily with breakfast.   rosuvastatin  (CRESTOR ) 10 MG tablet Take 1 tablet (10 mg total) by mouth at bedtime.   No facility-administered encounter medications on file as of 10/29/2023.    Allergies (verified) Codeine, Epinephrine, Erythromycin, and Morphine   History: Past Medical History:  Diagnosis Date   History of TIA (transient ischemic attack) 01/26/2022   Thyroid  disease    Past Surgical History:  Procedure Laterality Date   ABDOMINAL HYSTERECTOMY  11/21/2022   CYSTOCELE REPAIR     RECTOCELE REPAIR     TONSILLECTOMY     History reviewed. No pertinent family history. Social History   Socioeconomic History   Marital status: Married    Spouse name: Not on file   Number of children: Not on file   Years of education: Not on file   Highest education level: Bachelor's degree (e.g., BA, AB, BS)  Occupational History   Not on file  Tobacco Use   Smoking status: Never   Smokeless tobacco: Never  Vaping Use   Vaping status: Never Used  Substance and Sexual Activity   Alcohol use: Yes    Comment: A glass of wine a night   Drug use: Never   Sexual activity: Not on file  Other Topics Concern   Not on file  Social History Narrative   Not on file   Social Drivers of Health   Financial Resource Strain: Low Risk  (10/29/2023)   Overall Financial Resource Strain (CARDIA)    Difficulty of Paying Living Expenses: Not hard at all  Food Insecurity: No Food Insecurity (10/29/2023)   Hunger Vital Sign    Worried About Running Out of Food in the Last Year: Never true    Ran Out of Food in the Last Year: Never true   Transportation Needs: No Transportation Needs (10/29/2023)   PRAPARE - Administrator, Civil Service (Medical): No    Lack of Transportation (Non-Medical): No  Physical Activity: Inactive (10/29/2023)   Exercise Vital Sign    Days of Exercise per Week: 0 days    Minutes of Exercise per Session: 0 min  Stress: No Stress Concern Present (10/29/2023)   [employer] of Occupational Health - Occupational Stress Questionnaire    Feeling of Stress : Not at all  Social Connections: Moderately Integrated (10/29/2023)   Social Connection and Isolation Panel [NHANES]    Frequency of Communication with Friends and Family: More than three times a week    Frequency of Social Gatherings with Friends and Family: More than three times a week    Attends Religious Services: More than 4 times per year    Active Member of [employer] or Organizations: No    Attends Engineer, Structural: Never    Marital Status: Married    Tobacco Counseling Counseling given: Not Answered   Clinical Intake:  Pre-visit preparation completed: Yes  Pain : No/denies pain     Nutritional Risks: None Diabetes: No  How often do you need to have someone help you when you read instructions, pamphlets, or other written materials from your doctor or pharmacy?: 1 - Never  Interpreter Needed?: No  Information entered by :: Dawn Norris   Activities of Daily Living    10/25/2023    4:24 PM  In your present state of health, do you have any difficulty performing the following activities:  Hearing? 0  Vision? 0  Difficulty concentrating or making decisions? 0  Walking or climbing stairs? 0  Dressing or bathing? 0  Doing errands, shopping? 0  Preparing Food and eating ? N  Using the Toilet? N  In the past six months, have you accidently leaked urine? N  Do you have problems with loss of bowel control? N  Managing your Medications? N  Managing your Finances? N  Housekeeping or managing your  Housekeeping? N    Patient Care Team: Nche, Roselie Rockford, NP as PCP - General (Internal Medicine) Dawn Lonni CROME, MD as PCP - Cardiology (Cardiology) Dawn Soyla Lunger, MD as PCP - Electrophysiology (Cardiology)  Indicate any recent Medical Services you may have received from other than [facility] providers in the past year (date may be approximate).     Assessment:   This is a routine wellness examination for [place].  Hearing/Vision screen Hearing Screening - Comments:: Denies hearing issues Vision Screening - Comments:: Regular eye exams   Goals Addressed             This Visit's Progress    Patient Stated       10/29/2023, no goals       Depression Screen    10/29/2023    4:20 PM 09/05/2023    9:10 AM 10/27/2022    2:59 PM 10/25/2022  9:17 AM 09/30/2021    1:08 PM  PHQ 2/9 Scores  PHQ - 2 Score 0 0 0 0 0    Fall Risk    10/25/2023    4:24 PM 09/05/2023    9:10 AM 05/17/2023    9:08 AM 10/27/2022    2:59 PM 10/26/2022    2:41 PM  Fall Risk   Falls in the past year? 0 0 0 0   Number falls in past yr: 0 0 0 0 0  Injury with Fall? 0 0 0 0 0  Risk for fall due to : Medication side effect No Fall Risks No Fall Risks Impaired balance/gait;History of fall(s)   Follow up Falls prevention discussed;Falls evaluation completed Falls evaluation completed Falls evaluation completed Falls prevention discussed     MEDICARE RISK AT HOME: Medicare Risk at Home Any stairs in or around the home?: (Patient-Rptd) Yes If so, are there any without handrails?: (Patient-Rptd) No Home free of loose throw rugs in walkways, pet beds, electrical cords, etc?: (Patient-Rptd) Yes Adequate lighting in your home to reduce risk of falls?: (Patient-Rptd) Yes Life alert?: (Patient-Rptd) No Use of a cane, walker or w/c?: (Patient-Rptd) No Grab bars in the bathroom?: (Patient-Rptd) Yes Shower chair or bench in shower?: (Patient-Rptd) No Elevated toilet seat or a handicapped toilet?:  (Patient-Rptd) Yes  TIMED UP AND GO:  Was the test performed?  No    Cognitive Function:        10/29/2023    4:20 PM 10/27/2022    2:59 PM  6CIT Screen  What Year? 0 points 0 points  What month? 0 points 0 points  What time? 0 points 0 points  Count back from 20 0 points 0 points  Months in reverse 0 points 0 points  Repeat phrase 0 points 0 points  Total Score 0 points 0 points    Immunizations  There is no immunization history on file for this patient.  TDAP status: Due, Education has been provided regarding the importance of this vaccine. Advised may receive this vaccine at local pharmacy or Health Dept. Aware to provide a copy of the vaccination record if obtained from local pharmacy or Health Dept. Verbalized acceptance and understanding.  Flu Vaccine status: Declined, Education has been provided regarding the importance of this vaccine but patient still declined. Advised may receive this vaccine at local pharmacy or Health Dept. Aware to provide a copy of the vaccination record if obtained from local pharmacy or Health Dept. Verbalized acceptance and understanding.  Pneumococcal vaccine status: Declined,  Education has been provided regarding the importance of this vaccine but patient still declined. Advised may receive this vaccine at local pharmacy or Health Dept. Aware to provide a copy of the vaccination record if obtained from local pharmacy or Health Dept. Verbalized acceptance and understanding.   Covid-19 vaccine status: Declined, Education has been provided regarding the importance of this vaccine but patient still declined. Advised may receive this vaccine at local pharmacy or Health Dept.or vaccine clinic. Aware to provide a copy of the vaccination record if obtained from local pharmacy or Health Dept. Verbalized acceptance and understanding.  Qualifies for Shingles Vaccine? Yes   Zostavax completed No   Shingrix Completed?: No.    Education has been provided  regarding the importance of this vaccine. Patient has been advised to call insurance company to determine out of pocket expense if they have not yet received this vaccine. Advised may also receive vaccine at local pharmacy or Health Dept.  Verbalized acceptance and understanding.  Screening Tests Health Maintenance  Topic Date Due   COVID-19 Vaccine (1) 11/14/2023 (Originally 07/31/1951)   INFLUENZA VACCINE  01/14/2024 (Originally 05/17/2023)   Zoster Vaccines- Shingrix (1 of 2) 01/27/2024 (Originally 07/30/1965)   Pneumonia Vaccine 72+ Years old (1 of 1 - PCV) 10/28/2024 (Originally 07/31/2011)   Medicare Annual Wellness (AWV)  10/28/2024   HPV VACCINES  Aged Out   DTaP/Tdap/Td  Discontinued   DEXA SCAN  Discontinued   Hepatitis C Screening  Discontinued    Health Maintenance  There are no preventive care reminders to display for this patient.   Colorectal cancer screening: No longer required.   Mammogram status: No longer required due to age.  Bone Density status: n/a  Lung Cancer Screening: (Low Dose CT Chest recommended if Age 5-80 years, 20 pack-year currently smoking OR have quit w/in 15years.) does not qualify.   Lung Cancer Screening Referral: no  Additional Screening:  Hepatitis C Screening: does not qualify;   Vision Screening: Recommended annual ophthalmology exams for early detection of glaucoma and other disorders of the eye. Is the patient up to date with their annual eye exam?  Yes  Who is the provider or what is the name of the office in which the patient attends annual eye exams?  If pt is not established with a provider, would they like to be referred to a provider to establish care? No .   Dental Screening: Recommended annual dental exams for proper oral hygiene  Diabetic Foot Exam: n/a  Community Resource Referral / Chronic Care Management: CRR required this visit?  No   CCM required this visit?  No     Plan:     I have personally reviewed and  noted the following in the patient's chart:   Medical and social history Use of alcohol, tobacco or illicit drugs  Current medications and supplements including opioid prescriptions. Patient is not currently taking opioid prescriptions. Functional ability and status Nutritional status Physical activity Advanced directives List of other physicians Hospitalizations, surgeries, and ER visits in previous 12 months Vitals Screenings to include cognitive, depression, and falls Referrals and appointments  In addition, I have reviewed and discussed with patient certain preventive protocols, quality metrics, and best practice recommendations. A written personalized care plan for preventive services as well as general preventive health recommendations were provided to patient.     Ardella FORBES Dawn, Norris   8/[birth year]74   After Visit Summary: (MyChart) Due to this being a telephonic visit, the after visit summary with patients personalized plan was offered to patient via MyChart   Nurse Notes: none

## 2023-10-29 NOTE — Patient Instructions (Signed)
 [name] , Thank you for taking time to come for your Medicare Wellness Visit. I appreciate your ongoing commitment to your health goals. Please review the following plan we discussed and let me know if I can assist you in the future.   Referrals/Orders/Follow-Ups/Clinician Recommendations: none  This is a list of the screening recommended for you and due dates:  Health Maintenance  Topic Date Due   COVID-19 Vaccine (1) 11/14/2023*   Flu Shot  01/14/2024*   Zoster (Shingles) Vaccine (1 of 2) 01/27/2024*   Pneumonia Vaccine (1 of 1 - PCV) 10/28/2024*   Medicare Annual Wellness Visit  10/28/2024   HPV Vaccine  Aged Out   DTaP/Tdap/Td vaccine  Discontinued   DEXA scan (bone density measurement)  Discontinued   Hepatitis C Screening  Discontinued  *Topic was postponed. The date shown is not the original due date.    Advanced directives: (Copy Requested) Please bring a copy of your health care power of attorney and living will to the office to be added to your chart at your convenience.  Next Medicare Annual Wellness Visit scheduled for next year: No  insert Preventive Care attachment Insert FALL PREVENTION attachment if needed

## 2023-11-05 NOTE — Progress Notes (Unsigned)
New Patient Evaluation and Consultation  Referring Provider: Nche, Bonna Gains, NP PCP: Anne Ng, NP Date of Service: 11/06/2023  SUBJECTIVE Chief Complaint: No chief complaint on file.  History of Present Illness: Dawn Norris is a 78 y.o. {ED SANE ZOXW:96045} female seen in consultation at the request of NP Nche for evaluation of pelvic pressure.    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 fib  Urinary Symptoms: Does not leak urine.   Day time voids 10.  Nocturia: 0 times per night to void. Voiding dysfunction:  empties bladder well.  Patient does not use a catheter to empty bladder.  When urinating, patient feels {urine symptoms:24756} Drinks: ***oz water per day  UTIs: 1 UTI's in the last year.   {ACTIONS;DENIES/REPORTS:21021675::"Denies"} history of blood in urine, kidney or bladder stones, pyelonephritis, bladder cancer, and kidney cancer No results found for the last 90 days.   Pelvic Organ Prolapse Symptoms:                  Patient Admits to a feeling of a bulge the vaginal area. It has been present for 3 months.  Patient Denies seeing a bulge.  This bulge is bothersome.  Bowel Symptom: Bowel movements: *** time(s) per week Stool consistency: hard Straining: yes.  Splinting: yes.  Incomplete evacuation: yes.  Patient Denies accidental bowel leakage / fecal incontinence Bowel regimen: none Last colonoscopy: Date 12/06/2011 for hematochezia and abdominal pain with possible  ***, Results *** HM Colonoscopy   This patient has no relevant Health Maintenance data.     Sexual Function Sexually active: no.  Sexual orientation: Straight Pain with sex: Yes, at the vaginal  opening, has discomfort due to prolapse, has discomfort due to dryness  Pelvic Pain Denies pelvic pain  Past Medical History:  Past Medical History:  Diagnosis Date   History of TIA (transient ischemic attack) 01/26/2022   Thyroid disease      Past Surgical History:   Past Surgical History:  Procedure Laterality Date   ABDOMINAL HYSTERECTOMY  11/21/2022   CYSTOCELE REPAIR     RECTOCELE REPAIR     TONSILLECTOMY       Past OB/GYN History: OB History  No obstetric history on file.    Vaginal deliveries: ***,  Forceps/ Vacuum deliveries: ***, Cesarean section: *** Menopausal: Yes, at age 30, Denies vaginal bleeding since menopause Contraception: s/p menopause. Last pap smear was ***.  Any history of abnormal pap smears: no. No results found for: "DIAGPAP", "HPVHIGH", "ADEQPAP"  Medications: Patient has a current medication list which includes the following prescription(s): estradiol, ibuprofen, levothyroxine, levothyroxine, multivitamin with minerals, osphena, and rosuvastatin.   Allergies: Patient is allergic to codeine, epinephrine, erythromycin, and morphine.   Social History:  Social History   Tobacco Use   Smoking status: Never   Smokeless tobacco: Never  Vaping Use   Vaping status: Never Used  Substance Use Topics   Alcohol use: Yes    Comment: A glass of wine a night   Drug use: Never    Relationship status: married Patient lives with her husband.   Patient is not employed. Regular exercise: No History of abuse: No  Family History:  No family history on file.   Review of Systems:  Review of Systems  Constitutional:  Positive for malaise/fatigue. Negative for fever and weight loss.       Weight gain  Respiratory:  Negative for cough, shortness of breath and wheezing.   Cardiovascular:  Negative for chest pain, palpitations and leg swelling.  Gastrointestinal:  Positive for constipation. Negative for abdominal pain and blood in stool.  Genitourinary:   Positive for frequency. Negative for dysuria, hematuria and urgency.       Vaginal discharge  Skin:  Negative for rash.  Neurological:  Negative for dizziness, weakness and headaches.  Endo/Heme/Allergies:  Does not bruise/bleed easily.  Psychiatric/Behavioral:  Negative for depression. The patient is not nervous/anxious.      OBJECTIVE Physical Exam: There were no vitals filed for this visit.  Physical Exam Constitutional:      General: She is not in acute distress.    Appearance: Normal appearance.  Genitourinary:     Bladder and urethral meatus normal.     No lesions in the vagina.     Right Labia: No rash, tenderness, lesions, skin changes or Bartholin's cyst.    Left Labia: No tenderness, lesions, skin changes, Bartholin's cyst or rash.    No vaginal discharge, erythema, tenderness, bleeding, ulceration or granulation tissue.      Right Adnexa: not tender, not full and no mass present.    Left Adnexa: not tender, not full and no mass present.    Cervix is absent.     Uterus is absent.     Urethral meatus caruncle not present.    No urethral prolapse, tenderness, mass, hypermobility or discharge present.     Bladder is not tender, urgency on palpation not present and masses not present.      Levator ani not tender, obturator internus not tender, no asymmetrical contractions present and no pelvic spasms present.    Anal wink present and BC reflex present. Cardiovascular:     Rate and Rhythm: Normal rate.  Pulmonary:     Effort: Pulmonary effort is normal. No respiratory distress.  Abdominal:     General: There is no distension.     Palpations: There is no mass.     Tenderness: There is no abdominal tenderness.     Hernia: No hernia is present.  Neurological:     Mental Status: She is alert.  Vitals reviewed. Exam conducted with a chaperone present.     POP-Q:   POP-Q                                               Aa                                                Ba                                                 C  Gh                                               Pb                                               tvl                                                Ap                                               Bp                                                 D      Rectal Exam:  Normal sphincter tone, {rectocele:24766} distal rectocele, enterocoele {DESC; PRESENT/NOT PRESENT:21021351}, no rectal masses, {sign of:24767} dyssynergia when asking the patient to bear down.  Post-Void Residual (PVR) by Bladder Scan: In order to evaluate bladder emptying, we discussed obtaining a postvoid residual and patient agreed to this procedure.  Procedure: The ultrasound unit was placed on the patient's abdomen in the suprapubic region after the patient had voided.      Laboratory Results: Lab Results  Component Value Date   COLORU yellow 10/04/2021   CLARITYU cloudy (A) 10/04/2021   GLUCOSEUR negative 10/04/2021   BILIRUBINUR NEGATIVE 01/21/2022   SPECGRAV 1.010 10/04/2021   RBCUR moderate (A) 10/04/2021   PHUR 6.0 10/04/2021   PROTEINUR NEGATIVE 01/21/2022   UROBILINOGEN 0.2 10/04/2021   LEUKOCYTESUR TRACE (A) 01/21/2022    Lab Results  Component Value Date   CREATININE 0.85 10/25/2022   CREATININE 0.80 01/21/2022   CREATININE 0.93 01/21/2022    No results found for: "HGBA1C"  Lab Results  Component Value Date   HGB 13.8 10/25/2022     ASSESSMENT AND PLAN Ms. Chea is a 78 y.o. with: No diagnosis found.  There are no diagnoses linked to this encounter.  Time spent: I spent *** minutes dedicated to the care of this patient on the date of this encounter to include pre-visit review of records, face-to-face time with the patient discussing *** and post visit documentation and ordering medication/ testing.    Loleta Chance, MD

## 2023-11-06 ENCOUNTER — Other Ambulatory Visit (HOSPITAL_COMMUNITY)
Admission: RE | Admit: 2023-11-06 | Discharge: 2023-11-06 | Disposition: A | Discharge status: Home / Self Care | Payer: Medicare Other | Source: Ambulatory Visit | Attending: Obstetrics | Admitting: Obstetrics

## 2023-11-06 ENCOUNTER — Encounter: Payer: Self-pay | Admitting: Obstetrics

## 2023-11-06 ENCOUNTER — Ambulatory Visit (INDEPENDENT_AMBULATORY_CARE_PROVIDER_SITE_OTHER): Payer: Medicare Other | Admitting: Obstetrics

## 2023-11-06 VITALS — BP 120/72 | HR 80 | Ht 64.96 in | Wt 141.0 lb

## 2023-11-06 DIAGNOSIS — N952 Postmenopausal atrophic vaginitis: Secondary | ICD-10-CM | POA: Insufficient documentation

## 2023-11-06 DIAGNOSIS — N898 Other specified noninflammatory disorders of vagina: Secondary | ICD-10-CM | POA: Diagnosis present

## 2023-11-06 DIAGNOSIS — K5900 Constipation, unspecified: Secondary | ICD-10-CM

## 2023-11-06 DIAGNOSIS — R350 Frequency of micturition: Secondary | ICD-10-CM | POA: Insufficient documentation

## 2023-11-06 DIAGNOSIS — Z9889 Other specified postprocedural states: Secondary | ICD-10-CM

## 2023-11-06 DIAGNOSIS — R102 Pelvic and perineal pain: Secondary | ICD-10-CM

## 2023-11-06 DIAGNOSIS — N8110 Cystocele, unspecified: Secondary | ICD-10-CM | POA: Diagnosis not present

## 2023-11-06 LAB — POCT URINALYSIS DIPSTICK
Bilirubin, UA: NEGATIVE
Glucose, UA: NEGATIVE
Ketones, UA: NEGATIVE
Nitrite, UA: NEGATIVE
Protein, UA: NEGATIVE
Spec Grav, UA: 1.01 (ref 1.010–1.025)
Urobilinogen, UA: 0.2 U/dL
pH, UA: 6 (ref 5.0–8.0)

## 2023-11-06 NOTE — Assessment & Plan Note (Signed)
-   recurrent pelvic organ prolapse - For treatment of pelvic organ prolapse, we discussed options for management including expectant management, conservative management, and surgical management, such as Kegels, a pessary, pelvic floor physical therapy, and specific surgical procedures. - declines pelvic floor PT or pessary - continue low dose vaginal estrogen - encouraged optimization of stool consistency

## 2023-11-06 NOTE — Assessment & Plan Note (Signed)
-   thin yellow discharge, denies itching and no bleeding - pending Nuswab

## 2023-11-06 NOTE — Patient Instructions (Signed)
You have a stage 2 (out of 4) prolapse.  We discussed the fact that it is not life threatening but there are several treatment options. For treatment of pelvic organ prolapse, we discussed options for management including expectant management, conservative management, and surgical management, such as Kegels, a pessary, pelvic floor physical therapy, and specific surgical procedures.     Constipation: Our goal is to achieve formed bowel movements daily or every-other-day.  You may need to try different combinations of the following options to find what works best for you - everybody's body works differently so feel free to adjust the dosages as needed.  Some options to help maintain bowel health include:  Dietary changes (more leafy greens, vegetables and fruits; less processed foods) Fiber supplementation (Benefiber, FiberCon, Metamucil or Psyllium). Start slow and increase gradually to full dose. Over-the-counter agents such as: stool softeners (Docusate or Colace) and/or laxatives (Miralax, milk of magnesia)  "Power Pudding" is a natural mixture that may help your constipation.  To make blend 1 cup applesauce, 1 cup wheat bran, and 3/4 cup prune juice, refrigerate and then take 1 tablespoon daily with a large glass of water as needed.   Women should try to eat at least 21 to 25 grams of fiber a day, while men should aim for 30 to 38 grams a day. You can add fiber to your diet with food or a fiber supplement such as psyllium (metamucil), benefiber, or fibercon.   Here's a look at how much dietary fiber is found in some common foods. When buying packaged foods, check the Nutrition Facts label for fiber content. It can vary among brands.  Fruits Serving size Total fiber (grams)*  Raspberries 1 cup 8.0  Pear 1 medium 5.5  Apple, with skin 1 medium 4.5  Banana 1 medium 3.0  Orange 1 medium 3.0  Strawberries 1 cup 3.0   Vegetables Serving size Total fiber (grams)*  Green peas, boiled 1 cup 9.0   Broccoli, boiled 1 cup chopped 5.0  Turnip greens, boiled 1 cup 5.0  Brussels sprouts, boiled 1 cup 4.0  Potato, with skin, baked 1 medium 4.0  Sweet corn, boiled 1 cup 3.5  Cauliflower, raw 1 cup chopped 2.0  Carrot, raw 1 medium 1.5   Grains Serving size Total fiber (grams)*  Spaghetti, whole-wheat, cooked 1 cup 6.0  Barley, pearled, cooked 1 cup 6.0  Bran flakes 3/4 cup 5.5  Quinoa, cooked 1 cup 5.0  Oat bran muffin 1 medium 5.0  Oatmeal, instant, cooked 1 cup 5.0  Popcorn, air-popped 3 cups 3.5  Brown rice, cooked 1 cup 3.5  Bread, whole-wheat 1 slice 2.0  Bread, rye 1 slice 2.0   Legumes, nuts and seeds Serving size Total fiber (grams)*  Split peas, boiled 1 cup 16.0  Lentils, boiled 1 cup 15.5  Black beans, boiled 1 cup 15.0  Baked beans, canned 1 cup 10.0  Chia seeds 1 ounce 10.0  Almonds 1 ounce (23 nuts) 3.5  Pistachios 1 ounce (49 nuts) 3.0  Sunflower kernels 1 ounce 3.0  *Rounded to nearest 0.5 gram. Source: [employer] for Standard Reference, Legacy Release    The origin of pelvic floor muscle spasm can be multifactorial, including primary, reactive to a different pain source, trauma, or even part of a centralized pain syndrome.Treatment options include pelvic floor physical therapy, local (vaginal) or oral  muscle relaxants, pelvic muscle trigger point injections or centrally acting pain medications.     For symptomatic vaginal atrophy  options include lubrication with a water-based lubricant, personal hygiene measures and barrier protection against wetness, and estrogen replacement in the form of vaginal cream, vaginal tablets, or a time-released vaginal ring.     Continue vaginal estrogen use 0.5-1g twice to three times a week.

## 2023-11-06 NOTE — Assessment & Plan Note (Signed)
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  We also discussed the importance of avoiding chronic straining, as it can exacerbate her pelvic floor symptoms; we discussed treating constipation and straining prior to surgery, as postoperative straining can lead to damage to the repair and recurrence of symptoms. We discussed initiating therapy with increasing fluid intake, fiber supplementation, stool softeners, and laxatives such as miralax.  - encouraged squatting position for defecation and avoid straining

## 2023-11-06 NOTE — Assessment & Plan Note (Signed)
-   using low dose vaginal estrogen, reports possible inconsistent use - For symptomatic vaginal atrophy options include lubrication with a water-based lubricant, personal hygiene measures and barrier protection against wetness, and estrogen replacement in the form of vaginal cream, vaginal tablets, or a time-released vaginal ring.   - reviewed dosing 0.5-1g twice to three times a week - encouraged samples of vaginal moisturizer

## 2023-11-06 NOTE — Assessment & Plan Note (Signed)
-   s/p TVH/vNOTES with BSO for atypical endometrial hyperplasia and hx Jewish descent in 11/22/22 by Dr. Maudry Diego - History of native tissue anterior/posterior repair in 2007 by Dr. Elonda Husky in [place], [place]. - will need urodynamics prior to surgical intervention

## 2023-11-06 NOTE — Assessment & Plan Note (Signed)
-   denies bother - POCT UA + leuk/heme, pending UA microscopy and culture

## 2023-11-07 ENCOUNTER — Encounter: Payer: Self-pay | Admitting: Cardiology

## 2023-11-07 ENCOUNTER — Encounter: Payer: Self-pay | Admitting: Obstetrics

## 2023-11-07 LAB — CERVICOVAGINAL ANCILLARY ONLY
Bacterial Vaginitis (gardnerella): NEGATIVE
Candida Glabrata: NEGATIVE
Candida Vaginitis: NEGATIVE
Comment: NEGATIVE
Comment: NEGATIVE
Comment: NEGATIVE

## 2023-11-07 NOTE — Telephone Encounter (Signed)
Error

## 2023-11-12 ENCOUNTER — Encounter: Payer: Self-pay | Admitting: Nurse Practitioner

## 2023-11-12 ENCOUNTER — Ambulatory Visit: Payer: Medicare Other | Admitting: Obstetrics

## 2023-11-14 ENCOUNTER — Other Ambulatory Visit: Payer: Self-pay

## 2023-11-14 DIAGNOSIS — E7800 Pure hypercholesterolemia, unspecified: Secondary | ICD-10-CM

## 2023-11-14 MED ORDER — ROSUVASTATIN CALCIUM 10 MG PO TABS: 10.0000 mg | ORAL_TABLET | Freq: Every day | ORAL | 3 refills | Status: DC

## 2023-11-26 ENCOUNTER — Telehealth: Payer: Self-pay

## 2023-11-26 ENCOUNTER — Encounter: Payer: Self-pay | Admitting: Obstetrics

## 2023-11-26 NOTE — Telephone Encounter (Signed)
 I called to ask in follow up to mychart message. She has stopped the vaginal cream Estradiol .she cannot tolerate the itching it has caused. She has tried multiple ways to insert the cream , even a Q-Tip. It is still causing severe itching. With that being said she filled a Rx Bactrim. And this is helping calm the itching. She refuses to retry after completing the antibiotics. She just wants to discuss surgery. She stopped fiber and will increase in her oral diet. Patient would like return communication via my chart only.

## 2023-11-27 ENCOUNTER — Encounter: Payer: Self-pay | Admitting: Obstetrics

## 2023-11-28 ENCOUNTER — Encounter: Payer: Self-pay | Admitting: Nurse Practitioner

## 2023-11-28 ENCOUNTER — Encounter: Payer: Medicare Other | Admitting: Nurse Practitioner

## 2023-11-29 ENCOUNTER — Encounter: Payer: Self-pay | Admitting: Nurse Practitioner

## 2023-11-29 NOTE — Progress Notes (Signed)
Visit scheduled in errorThis encounter was created in error - please disregard.

## 2024-01-15 ENCOUNTER — Encounter: Payer: Self-pay | Admitting: Cardiology

## 2024-01-15 ENCOUNTER — Ambulatory Visit: Payer: Medicare Other | Attending: Cardiology | Admitting: Cardiology

## 2024-01-15 VITALS — BP 110/74 | HR 84 | Ht 64.96 in | Wt 140.0 lb

## 2024-01-15 DIAGNOSIS — I480 Paroxysmal atrial fibrillation: Secondary | ICD-10-CM | POA: Insufficient documentation

## 2024-01-15 DIAGNOSIS — D6869 Other thrombophilia: Secondary | ICD-10-CM | POA: Insufficient documentation

## 2024-01-15 DIAGNOSIS — I491 Atrial premature depolarization: Secondary | ICD-10-CM | POA: Insufficient documentation

## 2024-01-15 NOTE — Progress Notes (Signed)
  Electrophysiology Office Note:   Date:  01/15/2024  ID:  Dawn Norris, DOB 08-03-1946, MRN [id number]  Primary Cardiologist: Little Ishikawa, MD Primary Heart Failure: None Electrophysiologist: Anaika Santillano Jorja Loa, MD      History of Present Illness:   Dawn Norris is a 78 y.o. female with h/o atrial fibrillation, hypertension, TIA, hypothyroidism seen today for routine electrophysiology followup.   Since last being seen in our clinic the patient reports doing overall well.  She has noted no palpitations.  She does not think that she has been in atrial fibrillation.  She has multiple PACs currently, but is asymptomatic.  she denies chest pain, palpitations, dyspnea, PND, orthopnea, nausea, vomiting, dizziness, syncope, edema, weight gain, or early satiety.   Review of systems complete and found to be negative unless listed in HPI.   EP Information / Studies Reviewed:    EKG is ordered today. Personal review as below.  EKG Interpretation Date/Time:  Tuesday January 15 2024 11:29:09 EDT Ventricular Rate:  84 PR Interval:  134 QRS Duration:  80 QT Interval:  336 QTC Calculation: 397 R Axis:   48  Text Interpretation: Sinus rhythm with Premature atrial complexes in a pattern of bigeminy When compared with ECG of 20-Jul-2023 09:00, No significant change was found Confirmed by Traeson Dusza ([id number]) on 01/15/2024 11:34:42 AM     Risk Assessment/Calculations:    CHA2DS2-VASc Score = 3   This indicates a 3.2% annual risk of stroke. The patient's score is based upon: CHF History: 0 HTN History: 0 Diabetes History: 0 Stroke History: 0 Vascular Disease History: 0 Age Score: 2 Gender Score: 1             Physical Exam:   VS:  BP 110/74 (BP Location: Left Arm, Patient Position: Sitting, Cuff Size: Normal)   Pulse 84   Ht 5' 4.96" (1.65 m)   Wt 140 lb (63.5 kg)   SpO2 97%   BMI 23.33 kg/m    Wt Readings from Last 3 Encounters:  01/15/24 140 lb (63.5 kg)  11/28/23 138  lb 1.9 oz (62.7 kg)  11/06/23 141 lb (64 kg)     GEN: Well nourished, well developed in no acute distress NECK: No JVD; No carotid bruits CARDIAC: Regular rate and rhythm, no murmurs, rubs, gallops RESPIRATORY:  Clear to auscultation without rales, wheezing or rhonchi  ABDOMEN: Soft, non-tender, non-distended EXTREMITIES:  No edema; No deformity   ASSESSMENT AND PLAN:    1.  Paroxysmal atrial fibrillation: She has had minimal episodes of atrial fibrillation.  She has been minimally symptomatic.  For now, we Nilam Quakenbush continue with current management.  2.  PACs: Noted on cardiac monitor.  No current symptoms.  Continue with current management.  3.  Hyperlipidemia: Continue statin per primary physician  4.  Secondary hypercoagulable state: Patient has stopped Xarelto.  Understands that stroke risk remains.  This is patient choice.  Follow up with Dr. Elberta Fortis in 12 months  Signed, Quentyn Kolbeck Jorja Loa, MD

## 2024-02-04 ENCOUNTER — Encounter: Payer: Self-pay | Admitting: Obstetrics

## 2024-02-04 ENCOUNTER — Ambulatory Visit (INDEPENDENT_AMBULATORY_CARE_PROVIDER_SITE_OTHER): Payer: Medicare Other | Admitting: Obstetrics

## 2024-02-04 VITALS — BP 94/60 | HR 74

## 2024-02-04 DIAGNOSIS — N8110 Cystocele, unspecified: Secondary | ICD-10-CM | POA: Diagnosis not present

## 2024-02-04 DIAGNOSIS — R8290 Unspecified abnormal findings in urine: Secondary | ICD-10-CM | POA: Diagnosis not present

## 2024-02-04 DIAGNOSIS — N898 Other specified noninflammatory disorders of vagina: Secondary | ICD-10-CM | POA: Diagnosis not present

## 2024-02-04 DIAGNOSIS — K5900 Constipation, unspecified: Secondary | ICD-10-CM

## 2024-02-04 DIAGNOSIS — N952 Postmenopausal atrophic vaginitis: Secondary | ICD-10-CM

## 2024-02-04 LAB — POCT URINALYSIS DIPSTICK
Bilirubin, UA: NEGATIVE
Glucose, UA: NEGATIVE
Ketones, UA: NEGATIVE
Leukocytes, UA: NEGATIVE
Nitrite, UA: NEGATIVE
Protein, UA: NEGATIVE
Spec Grav, UA: 1.015 (ref 1.010–1.025)
Urobilinogen, UA: 0.2 U/dL
pH, UA: 5.5 (ref 5.0–8.0)

## 2024-02-04 MED ORDER — ESTRADIOL 10 MCG VA TABS: 1.0000 | ORAL_TABLET | VAGINAL | 3 refills | Status: DC

## 2024-02-04 NOTE — Patient Instructions (Addendum)
 Start vaginal estrogen therapy 1 tab nightly for 2 weeks, then 2 times weekly at night. This can be placed with your finger or an applicator inside the vagina.  Please let us  know if the prescription is too expensive and we can look for alternative options.

## 2024-02-04 NOTE — Assessment & Plan Note (Signed)
-   using low dose vaginal estrogen, reports possible inconsistent use - For symptomatic vaginal atrophy options include lubrication with a water-based lubricant, personal hygiene measures and barrier protection against wetness, and estrogen replacement in the form of vaginal cream, vaginal tablets, or a time-released vaginal ring.   - unable to tolerate vaginal estrogen cream due to itching, changed to Vagifem  10mcg nightly followed by 2x/week. If irritation, will change to compounding pharmacy or crush medication into 0.5 of of Vaseline  - encouraged vaginal moisturizer

## 2024-02-04 NOTE — Assessment & Plan Note (Signed)
-   No OP note available from native tissue anterior/posterior repair in 2007 by Dr. Gara July in [place], [place]  - For treatment of pelvic organ prolapse, we discussed options for management including expectant management, conservative management, and surgical management, such as Kegels, a pessary, pelvic floor physical therapy, and specific surgical procedures. - We discussed two options for prolapse repair:  1) vaginal repair without mesh - Pros - safer, no mesh complications - Cons - not as strong as mesh repair, higher risk of recurrence  2) laparoscopic repair with mesh - Pros - stronger, better long-term success - Cons - risks of mesh implant (erosion into vagina or bladder, adhering to the rectum, pain) - these risks are lower than with a vaginal mesh but still exist - declines mesh use and desires to proceed with native tissue suspension - patient desires to postpone surgical intervention until 07/2024, return for follow-up in 5 months

## 2024-02-04 NOTE — Progress Notes (Signed)
 [facility] Return Visit  SUBJECTIVE  History of Present Illness: Dawn Norris is a 78 y.o. female seen in follow-up for stage II pelvic organ prolapse, constipation, vaginal atrophy, history of pelvic surgery, urinary frequency and vaginal discharge. Plan at last visit was treatment of constipation, continue low dose vaginal estrogen, and vaginal moisturizer.   Unable to tolerate vaginal estrogen due to itching  Metamucil caused gas, uses metamucil capsules 4/day with less gas. Uses 3 prunes intake with improved bowel frequency BM bristol IV every 2 days with reduced straining and denies splinting.  Similar symptoms from vaginal bulge.  Negative Nuswab 11/06/23 Denies gross hematuria   Past Medical History: Patient  has a past medical history of History of TIA (transient ischemic attack) (01/26/2022) and Thyroid  disease.   Past Surgical History: She  has a past surgical history that includes Cystocele repair; Tonsillectomy; Rectocele repair; and Vaginal hysterectomy (2024).   Medications: She has a current medication list which includes the following prescription(s): estradiol , ibuprofen, levothyroxine , levothyroxine , multivitamin with minerals, and rosuvastatin .   Allergies: Patient is allergic to codeine, epinephrine, erythromycin, and morphine.   Social History: Patient  reports that she has quit smoking. Her smoking use included cigarettes. She has never used smokeless tobacco. She reports current alcohol use. She reports that she does not use drugs.     OBJECTIVE     Physical Exam: Vitals:   02/04/24 1513  BP: 94/60  Pulse: 74   Gen: No apparent distress, A&O x 3.  Detailed Urogynecologic Evaluation:  Deferred. Prior exam showed:  POP-Q  1                                            Aa   1                                           Ba  -2                                              C   3                                            Gh  2                                             Pb  5                                            tvl   -2                                            Ap  -2  Bp                                                 D    Lab Results  Component Value Date   COLORU yellow 02/04/2024   CLARITYU Clear 02/04/2024   GLUCOSEUR Negative 02/04/2024   BILIRUBINUR Negative 02/04/2024   KETONESU Negative 02/04/2024   SPECGRAV 1.015 02/04/2024   RBCUR Trace 02/04/2024   PHUR 5.5 02/04/2024   PROTEINUR Negative 02/04/2024   UROBILINOGEN 0.2 02/04/2024   LEUKOCYTESUR Negative 02/04/2024        No data to display             ASSESSMENT AND PLAN    Dawn Norris is a 78 y.o. with:  1. Pelvic organ prolapse quantification stage 2 cystocele   2. Constipation, unspecified constipation type   3. Vaginal atrophy   4. Vaginal discharge   5. Abnormal urinalysis     Pelvic organ prolapse quantification stage 2 cystocele Assessment & Plan: - No OP note available from native tissue anterior/posterior repair in 2007 by Dr. Gara July in [place], [place]  - For treatment of pelvic organ prolapse, we discussed options for management including expectant management, conservative management, and surgical management, such as Kegels, a pessary, pelvic floor physical therapy, and specific surgical procedures. - We discussed two options for prolapse repair:  1) vaginal repair without mesh - Pros - safer, no mesh complications - Cons - not as strong as mesh repair, higher risk of recurrence  2) laparoscopic repair with mesh - Pros - stronger, better long-term success - Cons - risks of mesh implant (erosion into vagina or bladder, adhering to the rectum, pain) - these risks are lower than with a vaginal mesh but still exist - declines mesh use and desires to proceed with native tissue suspension - patient desires to postpone surgical intervention until 07/2024, return for follow-up  in 5 months   Constipation, unspecified constipation type Assessment & Plan: - chronic issue, discussed likely related to recurrent prolapse - For constipation, we reviewed the importance of a better bowel regimen.  We also discussed the importance of avoiding chronic straining, as it can exacerbate her pelvic floor symptoms; we discussed treating constipation and straining prior to surgery, as postoperative straining can lead to damage to the repair and recurrence of symptoms. We discussed initiating therapy with increasing fluid intake, fiber supplementation, stool softeners, and laxatives such as miralax.  - encouraged squatting position for defecation and avoid straining - continue metamucil capsules and prunes, discussed 2 kiwis/day rather than miralax since she prefers capsule or tablet forms of fiber supplementation   Vaginal atrophy Assessment & Plan: - using low dose vaginal estrogen, reports possible inconsistent use - For symptomatic vaginal atrophy options include lubrication with a water-based lubricant, personal hygiene measures and barrier protection against wetness, and estrogen replacement in the form of vaginal cream, vaginal tablets, or a time-released vaginal ring.   - unable to tolerate vaginal estrogen cream due to itching, changed to Vagifem  10mcg nightly followed by 2x/week. If irritation, will change to compounding pharmacy or crush medication into 0.5 of of Vaseline  - encouraged vaginal moisturizer   Vaginal discharge Assessment & Plan: - thin yellow discharge, denies itching and no bleeding - negative Nuswab for infection - likely due to atrophy, start vagifem    Abnormal urinalysis Assessment & Plan: -  repeat POCT UA clean catch + trace heme, pending UA microscopy and culture - possibly due to vaginal atrophy, Rx vagifem  - repeat UA at follow-up, consider catheterized sample  Orders: -     Urine Culture; Future -     Urine Microscopic; Future -     POCT  urinalysis dipstick  Other orders -     Estradiol ; Place 1 tablet (10 mcg total) vaginally 2 (two) times a week. Place 1 tab nightly for two weeks then twice a week after  Dispense: 8 tablet; Refill: 3  Time spent: I spent 32 minutes dedicated to the care of this patient on the date of this encounter to include pre-visit review of records, face-to-face time with the patient discussing stage II pelvic organ prolapse, constipation, vaginal atrophy, vaginal discharge, and post visit documentation and ordering medication.    Darlene Ehlers, MD

## 2024-02-04 NOTE — Assessment & Plan Note (Signed)
-   thin yellow discharge, denies itching and no bleeding - negative Nuswab for infection - likely due to atrophy, start vagifem

## 2024-02-04 NOTE — Assessment & Plan Note (Signed)
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  We also discussed the importance of avoiding chronic straining, as it can exacerbate her pelvic floor symptoms; we discussed treating constipation and straining prior to surgery, as postoperative straining can lead to damage to the repair and recurrence of symptoms. We discussed initiating therapy with increasing fluid intake, fiber supplementation, stool softeners, and laxatives such as miralax.  - encouraged squatting position for defecation and avoid straining - continue metamucil capsules and prunes, discussed 2 kiwis/day rather than miralax since she prefers capsule or tablet forms of fiber supplementation

## 2024-02-04 NOTE — Assessment & Plan Note (Addendum)
-   repeat POCT UA clean catch + trace heme, pending UA microscopy and culture - possibly due to vaginal atrophy, Rx vagifem  - repeat UA at follow-up, consider catheterized sample

## 2024-02-05 ENCOUNTER — Encounter: Payer: Self-pay | Admitting: Obstetrics

## 2024-02-05 ENCOUNTER — Other Ambulatory Visit (HOSPITAL_COMMUNITY)
Admission: RE | Admit: 2024-02-05 | Discharge: 2024-02-05 | Disposition: A | Discharge status: Home / Self Care | Source: Other Acute Inpatient Hospital | Attending: Obstetrics | Admitting: Obstetrics

## 2024-02-05 DIAGNOSIS — R8290 Unspecified abnormal findings in urine: Secondary | ICD-10-CM | POA: Insufficient documentation

## 2024-02-05 LAB — URINALYSIS, ROUTINE W REFLEX MICROSCOPIC
Bacteria, UA: NONE SEEN
Bilirubin Urine: NEGATIVE
Glucose, UA: NEGATIVE mg/dL
Hgb urine dipstick: NEGATIVE
Ketones, ur: NEGATIVE mg/dL
Nitrite: NEGATIVE
Protein, ur: NEGATIVE mg/dL
Specific Gravity, Urine: 1.015 (ref 1.005–1.030)
pH: 6 (ref 5.0–8.0)

## 2024-02-06 LAB — URINE CULTURE: Culture: NO GROWTH

## 2024-02-07 ENCOUNTER — Encounter: Payer: Self-pay | Admitting: Obstetrics

## 2024-03-11 ENCOUNTER — Encounter: Payer: Self-pay | Admitting: Nurse Practitioner

## 2024-03-17 ENCOUNTER — Ambulatory Visit (INDEPENDENT_AMBULATORY_CARE_PROVIDER_SITE_OTHER): Admitting: Nurse Practitioner

## 2024-03-17 ENCOUNTER — Encounter: Payer: Self-pay | Admitting: Nurse Practitioner

## 2024-03-17 ENCOUNTER — Ambulatory Visit: Payer: Self-pay | Admitting: Nurse Practitioner

## 2024-03-17 VITALS — BP 102/60 | HR 67 | Temp 96.7°F | Ht 65.0 in | Wt 143.2 lb

## 2024-03-17 DIAGNOSIS — E7800 Pure hypercholesterolemia, unspecified: Secondary | ICD-10-CM

## 2024-03-17 DIAGNOSIS — B351 Tinea unguium: Secondary | ICD-10-CM | POA: Insufficient documentation

## 2024-03-17 DIAGNOSIS — E039 Hypothyroidism, unspecified: Secondary | ICD-10-CM

## 2024-03-17 LAB — COMPREHENSIVE METABOLIC PANEL WITH GFR
ALT: 26 U/L (ref 0–35)
AST: 27 U/L (ref 0–37)
Albumin: 4.3 g/dL (ref 3.5–5.2)
Alkaline Phosphatase: 50 U/L (ref 39–117)
BUN: 21 mg/dL (ref 6–23)
CO2: 31 meq/L (ref 19–32)
Calcium: 9.6 mg/dL (ref 8.4–10.5)
Chloride: 102 meq/L (ref 96–112)
Creatinine, Ser: 0.85 mg/dL (ref 0.40–1.20)
GFR: 65.99 mL/min (ref >60.00)
Glucose, Bld: 65 mg/dL — ABNORMAL LOW (ref 70–99)
Potassium: 4 meq/L (ref 3.5–5.1)
Sodium: 139 meq/L (ref 135–145)
Total Bilirubin: 0.5 mg/dL (ref 0.2–1.2)
Total Protein: 6.9 g/dL (ref 6.0–8.3)

## 2024-03-17 LAB — T4, FREE: Free T4: 0.89 ng/dL (ref 0.60–1.60)

## 2024-03-17 LAB — TSH: TSH: 3.81 u[IU]/mL (ref 0.35–5.50)

## 2024-03-17 MED ORDER — LEVOTHYROXINE SODIUM 75 MCG PO TABS: ORAL_TABLET | ORAL | 1 refills | Status: DC

## 2024-03-17 MED ORDER — LEVOTHYROXINE SODIUM 50 MCG PO TABS: ORAL_TABLET | ORAL | 1 refills | Status: DC

## 2024-03-17 NOTE — Progress Notes (Signed)
 Acute Office Visit  Subjective:    Patient ID: Dawn Norris, female    DOB: 10/01/1946, 78 y.o.   MRN: [id number]  Chief Complaint  Patient presents with   Nail Problem    Left pinky nail discoloration and not growing for a month no pain only discomfort    HPI Hypothyroidism Repeat Tsh and T4: stable Maintain levothyroxine  dose Refill sent  Onychomycosis Left 4th and 5th fingernail. She declined use of oral antifungal due to potential adverse effects Advised to use vinegar soak and castor/tea tree oil  Outpatient Medications Prior to Visit  Medication Sig   Estradiol  10 MCG TABS vaginal tablet Place 1 tablet (10 mcg total) vaginally 2 (two) times a week. Place 1 tab nightly for two weeks then twice a week after   ibuprofen (ADVIL) 200 MG tablet Take 200 mg by mouth 3 (three) times daily as needed (pain).   Multiple Vitamin (MULTIVITAMIN WITH MINERALS) TABS tablet Take 1 tablet by mouth every morning.   rosuvastatin  (CRESTOR ) 10 MG tablet Take 1 tablet (10 mg total) by mouth at bedtime.   [DISCONTINUED] levothyroxine  (SYNTHROID ) 50 MCG tablet TAKE 1 TABLET BY MOUTH EVERY TUESDAY, THURSDAY, SATURDAY, AND SUNDAY.   [DISCONTINUED] levothyroxine  (SYNTHROID ) 75 MCG tablet TAKE 1 TABLET BY MOUTH ON MONDAY, WEDNESDAY AND FRIDAY   No facility-administered medications prior to visit.   Reviewed past medical and social history.  Review of Systems Per HPI     Objective:    Physical Exam Vitals and nursing note reviewed.  Cardiovascular:     Rate and Rhythm: Normal rate.     Pulses: Normal pulses.  Pulmonary:     Effort: Pulmonary effort is normal.  Neurological:     Mental Status: She is alert.     BP 102/60 (BP Location: Left Arm, Patient Position: Sitting, Cuff Size: Large)   Pulse 67   Temp (!) 96.7 F (35.9 C) (Temporal)   Ht 5\' 5"  (1.651 m)   Wt 143 lb 3.2 oz (65 kg)   SpO2 96%   BMI 23.83 kg/m    Results for orders placed or performed in visit on 03/17/24   T4, free  Result Value Ref Range   Free T4 0.89 0.60 - 1.60 ng/dL  TSH  Result Value Ref Range   TSH 3.81 0.35 - 5.50 uIU/mL  Comprehensive metabolic panel with GFR  Result Value Ref Range   Sodium 139 135 - 145 mEq/L   Potassium 4.0 3.5 - 5.1 mEq/L   Chloride 102 96 - 112 mEq/L   CO2 31 19 - 32 mEq/L   Glucose, Bld 65 (L) 70 - 99 mg/dL   BUN 21 6 - 23 mg/dL   Creatinine, Ser 4.09 0.40 - 1.20 mg/dL   Total Bilirubin 0.5 0.2 - 1.2 mg/dL   Alkaline Phosphatase 50 39 - 117 U/L   AST 27 0 - 37 U/L   ALT 26 0 - 35 U/L   Total Protein 6.9 6.0 - 8.3 g/dL   Albumin 4.3 3.5 - 5.2 g/dL   GFR 81.19 >14.78 mL/min   Calcium  9.6 8.4 - 10.5 mg/dL       Assessment & Plan:   Problem List Items Addressed This Visit     Hypothyroidism   Repeat Tsh and T4: stable Maintain levothyroxine  dose Refill sent      Relevant Orders   T4, free (Completed)   TSH (Completed)   Onychomycosis - Primary   Left 4th and 5th fingernail. She  declined use of oral antifungal due to potential adverse effects Advised to use vinegar soak and castor/tea tree oil      Pure hypercholesterolemia   Relevant Orders   Comprehensive metabolic panel with GFR (Completed)   No orders of the defined types were placed in this encounter.  Return in about 6 months (around 09/16/2024) for Hypothyroidism, hyperlipidemia (fasting).  Kathrene Parents, NP

## 2024-03-17 NOTE — Assessment & Plan Note (Signed)
 Left 4th and 5th fingernail. She declined use of oral antifungal due to potential adverse effects Advised to use vinegar soak and castor/tea tree oil

## 2024-03-17 NOTE — Patient Instructions (Addendum)
 Soak nails in 2cups of water mixed with epsom salt 1tsp and 2tbsp of white vinegar. Apply tea tree oil and castor oil daily.  Go to lab

## 2024-03-17 NOTE — Assessment & Plan Note (Signed)
 Repeat Tsh and T4: stable Maintain levothyroxine  dose Refill sent

## 2024-04-08 ENCOUNTER — Ambulatory Visit: Payer: Self-pay

## 2024-04-08 NOTE — Telephone Encounter (Signed)
 FYI Only or Action Required?: FYI only for provider.  Patient was last seen in primary care on 03/17/2024 by Dawn Norris, Dawn Rockford, NP. Called Nurse Triage reporting Nail Problem. Symptoms began yesterday. Interventions attempted: Nothing. Symptoms are: gradually worsening.  Triage Disposition: See Physician Within 24 Hours  Patient/caregiver understands and will follow disposition?: Yes  Copied from CRM [id number]. Topic: Clinical - Red Word Triage >> Apr 08, 2024  9:55 AM Burnard DEL wrote: Red Word that prompted transfer to Nurse Triage: nails on left hand under nail bed turning purple,purple line going down right arm Reason for Disposition  Nursing judgment or information in reference  Answer Assessment - Initial Assessment Questions f  Answer Assessment - Initial Assessment Questions 1. REASON FOR CALL: What is your main concern right now?     Bluish coloration to nail beds bilateral hands. Started at left hand 04/07/24, now right hand nail beds are bluish in color.  2. ONSET: When did the discoloration start?     04/07/24 3. SEVERITY: How bad is the discoloration?     bluish 4. FEVER: Do you have a fever?     Denies 5. OTHER SYMPTOMS: Do you have any other new symptoms?     Denies 6. TREATMENTS AND RESPONSE: What have you done so far to try to make this better? What medicines have you used?     Nothing  Additional info: Denies all other symptoms. Difficult to triage: when asked are you hands cooler to touch than other areas she snickers and stated I don't have Reynaud's, When asked about redness or swelling again she snickered and said it's not a blood clot. When asked do you know what could be causing the discoloration she stated No, otherwise I wouldn't be calling the doctor, do you know what is causing it.  Protocols used: Hand Swelling-A-AH, No Guideline Available-A-AH

## 2024-04-09 ENCOUNTER — Ambulatory Visit: Admitting: Nurse Practitioner

## 2024-04-09 ENCOUNTER — Encounter: Payer: Self-pay | Admitting: Nurse Practitioner

## 2024-04-10 NOTE — Progress Notes (Signed)
 She was not seen. She left due to long wait time This encounter was created in error - please disregard.

## 2024-05-20 ENCOUNTER — Other Ambulatory Visit: Payer: Self-pay | Admitting: Obstetrics

## 2024-05-22 ENCOUNTER — Encounter: Payer: Self-pay | Admitting: Obstetrics

## 2024-06-19 ENCOUNTER — Encounter (INDEPENDENT_AMBULATORY_CARE_PROVIDER_SITE_OTHER): Payer: Self-pay | Admitting: Nurse Practitioner

## 2024-06-19 ENCOUNTER — Ambulatory Visit (INDEPENDENT_AMBULATORY_CARE_PROVIDER_SITE_OTHER): Admitting: Obstetrics

## 2024-06-19 ENCOUNTER — Encounter: Payer: Self-pay | Admitting: Obstetrics

## 2024-06-19 VITALS — BP 139/53 | HR 47

## 2024-06-19 DIAGNOSIS — Z9889 Other specified postprocedural states: Secondary | ICD-10-CM

## 2024-06-19 DIAGNOSIS — N8110 Cystocele, unspecified: Secondary | ICD-10-CM

## 2024-06-19 DIAGNOSIS — N952 Postmenopausal atrophic vaginitis: Secondary | ICD-10-CM

## 2024-06-19 DIAGNOSIS — K5900 Constipation, unspecified: Secondary | ICD-10-CM

## 2024-06-19 NOTE — Assessment & Plan Note (Signed)
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  We also discussed the importance of avoiding chronic straining, as it can exacerbate her pelvic floor symptoms; we discussed treating constipation and straining prior to surgery, as postoperative straining can lead to damage to the repair and recurrence of symptoms. We discussed initiating therapy with increasing fluid intake, fiber supplementation, stool softeners, and laxatives such as miralax.  - encouraged squatting position for defecation and avoid straining - continue metamucil capsules and prunes, previously prefers capsule or tablet forms of fiber supplementation - encouraged trial of 2 kiwis/day rather than miralax due to nausea and gas - encouraged trial of fiber gummy - encouraged follow-up with PCP or referral to GI for secretogogues due to chronic constipation to minimize straining

## 2024-06-19 NOTE — Assessment & Plan Note (Signed)
-   For symptomatic vaginal atrophy options include lubrication with a water-based lubricant, personal hygiene measures and barrier protection against wetness, and estrogen replacement in the form of vaginal cream, vaginal tablets, or a time-released vaginal ring.   - unable to tolerate vaginal estrogen cream due to itching - continue Vagifem  10mcg 2x/week due to relief  - encouraged vaginal moisturizer

## 2024-06-19 NOTE — Patient Instructions (Addendum)
 Please reach out to your primary care provider regarding possible Linzess use.   Continue Vagifem  use twice a week  Return for pessary fitting.  - discussed risk of change in urinary or bowel symptoms, vaginal ulceration, discharge, bleeding, fistula formation. You may require multiple sizes and types for fitting.

## 2024-06-19 NOTE — Progress Notes (Signed)
 [facility] Return Visit  SUBJECTIVE  History of Present Illness: Syan Cullimore is a 78 y.o. female seen in follow-up for stage II pelvic organ prolapse, constipation, vaginal atrophy, history of pelvic surgery, urinary frequency and vaginal discharge. Plan at last visit was treatment of constipation, continue low dose vaginal estrogen tablet, and vaginal moisturizer.   Recently went to [place]  when her son was overseas Switched from vaginal estrogen cream due to itching to vaginal insert with Vagifem . Reports reduction of dryness and 95% improvement in comfort  Metamucil powder caused gas and nausea with uses metamucil capsules 4/day with less gas. Uses 3 prunes intake with improved bowel frequency Tried miralax without relief Has not tried benefiber or gummies BM bristol IV every 2 days with reduced straining and denies splinting.  Similar symptoms from vaginal bulge.  Negative Nuswab 11/06/23 Denies gross hematuria Nervous about anesthesia, desires trial of pessary after improvement of constipation. Husband recently underwent surgery to repair hydrocele  Past Medical History: Patient  has a past medical history of History of TIA (transient ischemic attack) (01/26/2022) and Thyroid  disease.   Past Surgical History: She  has a past surgical history that includes Cystocele repair; Tonsillectomy; Rectocele repair; and Vaginal hysterectomy (2024).   Medications: She has a current medication list which includes the following prescription(s): estradiol , ibuprofen, levothyroxine , levothyroxine , multivitamin with minerals, and rosuvastatin .   Allergies: Patient is allergic to codeine, epinephrine, erythromycin, and morphine.   Social History: Patient  reports that she has quit smoking. Her smoking use included cigarettes. She has never used smokeless tobacco. She reports current alcohol use. She reports that she does not use drugs.     OBJECTIVE     Physical  Exam: Vitals:   06/19/24 0928  BP: (!) 139/53  Pulse: (!) 47   Gen: No apparent distress, A&O x 3.       ASSESSMENT AND PLAN    Ms. Hartis is a 78 y.o. with:  1. Pelvic organ prolapse quantification stage 2 cystocele   2. Constipation, unspecified constipation type   3. Vaginal atrophy   4. History of pelvic surgery      Pelvic organ prolapse quantification stage 2 cystocele Assessment & Plan: - No OP note available from native tissue anterior/posterior repair in 2007 by Dr. Jerome in [place], [place]  - For treatment of pelvic organ prolapse, we discussed options for management including expectant management, conservative management, and surgical management, such as Kegels, a pessary, pelvic floor physical therapy, and specific surgical procedures. - We discussed two options for prolapse repair:  1) vaginal repair without mesh - Pros - safer, no mesh complications - Cons - not as strong as mesh repair, higher risk of recurrence  2) laparoscopic repair with mesh - Pros - stronger, better long-term success - Cons - risks of mesh implant (erosion into vagina or bladder, adhering to the rectum, pain) - these risks are lower than with a vaginal mesh but still exist - declines mesh use and previously desired to proceed with native tissue suspension - due to concerns with anesthesia, desires to return for pessary trial after optimization of bowel consistency due to improvement in vaginal dryness and irritation since vaginal estrogen - continue vaginal estrogen use   Constipation, unspecified constipation type Assessment & Plan: - chronic issue, discussed likely related to recurrent prolapse - For constipation, we reviewed the importance of a better bowel regimen.  We also discussed the importance of avoiding chronic straining, as it can exacerbate her pelvic floor  symptoms; we discussed treating constipation and straining prior to surgery, as postoperative straining can lead to  damage to the repair and recurrence of symptoms. We discussed initiating therapy with increasing fluid intake, fiber supplementation, stool softeners, and laxatives such as miralax.  - encouraged squatting position for defecation and avoid straining - continue metamucil capsules and prunes, previously prefers capsule or tablet forms of fiber supplementation - encouraged trial of 2 kiwis/day rather than miralax due to nausea and gas - encouraged trial of fiber gummy - encouraged follow-up with PCP or referral to GI for secretogogues due to chronic constipation to minimize straining    Vaginal atrophy Assessment & Plan: - For symptomatic vaginal atrophy options include lubrication with a water-based lubricant, personal hygiene measures and barrier protection against wetness, and estrogen replacement in the form of vaginal cream, vaginal tablets, or a time-released vaginal ring.   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 Betsey - History of native tissue anterior/posterior repair in 2007 by Dr. Jerome in [place], [place]. - discussed need urodynamics prior to surgical intervention if she fails pessary trial    Time spent: I spent 15 minutes dedicated to the care of this patient on the date of this encounter to include pre-visit review of records, face-to-face time with the patient discussing stage II pelvic organ prolapse, constipation, vaginal atrophy, and post visit documentation and ordering medication.    Lianne ONEIDA Gillis, MD

## 2024-06-19 NOTE — Assessment & Plan Note (Signed)
-   No OP note available from native tissue anterior/posterior repair in 2007 by Dr. Jerome in [place], [place]  - For treatment of pelvic organ prolapse, we discussed options for management including expectant management, conservative management, and surgical management, such as Kegels, a pessary, pelvic floor physical therapy, and specific surgical procedures. - We discussed two options for prolapse repair:  1) vaginal repair without mesh - Pros - safer, no mesh complications - Cons - not as strong as mesh repair, higher risk of recurrence  2) laparoscopic repair with mesh - Pros - stronger, better long-term success - Cons - risks of mesh implant (erosion into vagina or bladder, adhering to the rectum, pain) - these risks are lower than with a vaginal mesh but still exist - declines mesh use and previously desired to proceed with native tissue suspension - due to concerns with anesthesia, desires to return for pessary trial after optimization of bowel consistency due to improvement in vaginal dryness and irritation since vaginal estrogen - continue vaginal estrogen use

## 2024-06-19 NOTE — Assessment & Plan Note (Signed)
-   s/p TVH/vNOTES with BSO for atypical endometrial hyperplasia and hx Jewish descent in 11/22/22 by Dr. Betsey - History of native tissue anterior/posterior repair in 2007 by Dr. Jerome in [place], [place]. - discussed need urodynamics prior to surgical intervention if she fails pessary trial

## 2024-06-20 NOTE — Telephone Encounter (Signed)
 Patient call and declined appointment and stated that an appointment is not necessary for her request. She would like to know if provider will approve or deny her request for medication.

## 2024-06-23 DIAGNOSIS — K5900 Constipation, unspecified: Secondary | ICD-10-CM | POA: Diagnosis not present

## 2024-06-24 MED ORDER — LUBIPROSTONE 8 MCG PO CAPS: 8.0000 ug | ORAL_CAPSULE | Freq: Two times a day (BID) | ORAL | 5 refills | Status: DC

## 2024-06-24 NOTE — Telephone Encounter (Signed)
Please see the MyChart message reply(ies) for my assessment and plan.  The patient gave consent for this Medical Advice Message and is aware that it may result in a bill to their insurance company as well as the possibility that this may result in a co-payment or deductible. They are an established patient, but are not seeking medical advice exclusively about a problem treated during an in person or video visit in the last 7 days. I did not recommend an in person or video visit within 7 days of my reply.  I spent a total of 12 minutes cumulative time within 7 days through [place], NP

## 2024-06-24 NOTE — Addendum Note (Signed)
 Addended by: KATHEEN ROSELIE CROME on: 06/24/2024 08:34 AM   Modules accepted: Orders

## 2024-06-26 ENCOUNTER — Encounter: Payer: Self-pay | Admitting: Nurse Practitioner

## 2024-07-09 ENCOUNTER — Ambulatory Visit: Admitting: Obstetrics

## 2024-08-25 ENCOUNTER — Encounter: Payer: Self-pay | Admitting: Nurse Practitioner

## 2024-08-25 DIAGNOSIS — K5900 Constipation, unspecified: Secondary | ICD-10-CM

## 2024-09-01 ENCOUNTER — Ambulatory Visit: Admitting: Obstetrics and Gynecology

## 2024-09-01 NOTE — Progress Notes (Unsigned)
 [facility] [address] [place] [address] Phone: [phone] Subjective:   Dawn Norris, am serving as a scribe for Dr. Arthea Claudene.  I'm seeing this patient by the request  of:  Nche, Roselie Rockford, NP  CC: Multiple joint complaints  YEP:Dlagzrupcz  Dawn Norris is a 78 y.o. female coming in with complaint of R knee, hip and lower back pain. Patient states that she has pain in L hip over the GT. Also has nerve pain in back of L leg. Intermittent tingling and numbness. Sitting and lying down increases her pain. Pain is constant but varying in intensity. Less pain when standing.   R knee pain with clicking for past few months. Walking increases pain.    Patient did have an MRI in May 2023.  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  History of urinary tract infection as well last month.  Past Medical History:  Diagnosis Date   History of TIA (transient ischemic attack) 01/26/2022   Thyroid  disease    Past Surgical History:  Procedure Laterality Date   CYSTOCELE REPAIR     RECTOCELE REPAIR     TONSILLECTOMY     VAGINAL HYSTERECTOMY  2024   Social History   Socioeconomic History   Marital status: Married    Spouse name: Not on file   Number of children: Not on file   Years of education: Not on file   Highest education level: Bachelor's degree (e.g., BA, AB, BS)  Occupational History   Not on file  Tobacco Use   Smoking status: Former    Types: Cigarettes   Smokeless tobacco: Never  Vaping Use   Vaping status: Never Used  Substance and Sexual Activity   Alcohol use: Yes    Comment: A glass of wine a night   Drug use: Never   Sexual activity: Not on file  Other Topics Concern   Not on file  Social History Narrative   Not on file   Social Drivers of Health   Financial Resource Strain: Low Risk  (04/08/2024)   Overall Financial  Resource Strain (CARDIA)    Difficulty of Paying Living Expenses: Not hard at all  Food Insecurity: No Food Insecurity (04/08/2024)   Hunger Vital Sign    Worried About Running Out of Food in the Last Year: Never true    Ran Out of Food in the Last Year: Never true  Transportation Needs: No Transportation Needs (04/08/2024)   PRAPARE - Administrator, Civil Service (Medical): No    Lack of Transportation (Non-Medical): No  Physical Activity: Insufficiently Active (04/08/2024)   Exercise Vital Sign    Days of Exercise per Week: 5 days    Minutes of Exercise per Session: 20 min  Stress: No Stress Concern Present (04/08/2024)   [employer] of Occupational Health - Occupational Stress Questionnaire    Feeling of Stress: Not at all  Social Connections: Socially Integrated (04/08/2024)   Social Connection and Isolation Panel    Frequency of Communication with Friends and Family: More than three times a week    Frequency of Social Gatherings with Friends and Family: Three times a week    Attends Religious Services: More than 4 times per year    Active Member of Clubs or Organizations: Yes    Attends Banker Meetings: Patient declined    Marital Status:  Married   Allergies  Allergen Reactions   Codeine Nausea And Vomiting   Epinephrine Other (See Comments)    A fib    Erythromycin Other (See Comments)    Unknown childhood reaction   Morphine Other (See Comments)    Severe headache   Family History  Problem Relation Age of Onset   Heart attack Mother    Heart disease Mother    Ovarian cancer Neg Hx    Uterine cancer Neg Hx    Bladder Cancer Neg Hx     Current Outpatient Medications (Endocrine & Metabolic):    levothyroxine  (SYNTHROID ) 50 MCG tablet, TAKE 1 TABLET BY MOUTH EVERY TUESDAY, THURSDAY, SATURDAY, AND SUNDAY.   levothyroxine  (SYNTHROID ) 75 MCG tablet, TAKE 1 TABLET BY MOUTH ON MONDAY, WEDNESDAY AND FRIDAY  Current Outpatient Medications  (Cardiovascular):    rosuvastatin  (CRESTOR ) 10 MG tablet, Take 1 tablet (10 mg total) by mouth at bedtime.   Current Outpatient Medications (Analgesics):    ibuprofen (ADVIL) 200 MG tablet, Take 200 mg by mouth 3 (three) times daily as needed (pain).   Current Outpatient Medications (Other):    Estradiol  10 MCG TABS vaginal tablet, INSERT 1 TABLET (10 MCG TOTAL) VAGINALLY EVERY NIGHT FOR 2 WEEKS, THEN INSERT 1 TABLET TWICE A WEEK AFTER   Multiple Vitamin (MULTIVITAMIN WITH MINERALS) TABS tablet, Take 1 tablet by mouth every morning.   lubiprostone  (AMITIZA ) 8 MCG capsule, Take 1 capsule (8 mcg total) by mouth 2 (two) times daily with a meal.   Reviewed prior external information including notes and imaging from  primary care provider As well as notes that were available from care everywhere and other healthcare systems.  Past medical history, social, surgical and family history all reviewed in electronic medical record.  No pertanent information unless stated regarding to the chief complaint.   Review of Systems:  No headache, visual changes, nausea, vomiting, diarrhea, constipation, dizziness, abdominal pain, skin rash, fevers, chills, night sweats, weight loss, swollen lymph nodes, body aches, joint swelling, chest pain, shortness of breath, mood changes. POSITIVE muscle aches  Objective  Blood pressure 110/82, pulse 62, height 5' 5 (1.651 m), weight 146 lb (66.2 kg), SpO2 98%.   General: No apparent distress alert and oriented x3 mood and affect normal, dressed appropriately.  HEENT: Pupils equal, extraocular movements intact  Respiratory: Patient's speak in full sentences and does not appear short of breath  Cardiovascular: No lower extremity edema, non tender, no erythema  Patient's low back does have significant loss of lordosis.  Patient does have a positive Trendelenburg noted on the left side.  Mild atrophy noted of the gluteal tendons on the left side.  Severe tenderness to  palpation over the greater trochanteric area.   Procedure: Real-time Ultrasound Guided Injection of left  greater trochanteric bursitis secondary to patient's body habitus Device: GE Logiq Q7  Ultrasound guided injection is preferred based studies that show increased duration, increased effect, greater accuracy, decreased procedural pain, increased response rate, and decreased cost with ultrasound guided versus blind injection.  Verbal informed consent obtained.  Time-out conducted.  Noted no overlying erythema, induration, or other signs of local infection.  Skin prepped in a sterile fashion.  Local anesthesia: Topical Ethyl chloride.  With sterile technique and under real time ultrasound guidance:  Greater trochanteric area was visualized and patient's bursa was noted. A 22-gauge 3 inch needle was inserted and 4 cc of 0.5% Marcaine and 1 cc of Kenalog  40 mg/dL was injected. Pictures taken Completed  without difficulty  Pain immediately resolved suggesting accurate placement of the medication.  Advised to call if fevers/chills, erythema, induration, drainage, or persistent bleeding.  Images permanently stored  Impression: Technically successful ultrasound guided injection.     Impression and Recommendations:     The above documentation has been reviewed and is accurate and complete Buzz Axel M Solene Hereford, DO

## 2024-09-02 ENCOUNTER — Encounter: Payer: Self-pay | Admitting: Family Medicine

## 2024-09-02 ENCOUNTER — Ambulatory Visit (INDEPENDENT_AMBULATORY_CARE_PROVIDER_SITE_OTHER): Admitting: Family Medicine

## 2024-09-02 ENCOUNTER — Ambulatory Visit (INDEPENDENT_AMBULATORY_CARE_PROVIDER_SITE_OTHER)

## 2024-09-02 ENCOUNTER — Other Ambulatory Visit: Payer: Self-pay

## 2024-09-02 VITALS — BP 110/82 | HR 62 | Ht 65.0 in | Wt 146.0 lb

## 2024-09-02 DIAGNOSIS — M25561 Pain in right knee: Secondary | ICD-10-CM | POA: Diagnosis not present

## 2024-09-02 DIAGNOSIS — M25552 Pain in left hip: Secondary | ICD-10-CM

## 2024-09-02 DIAGNOSIS — M5416 Radiculopathy, lumbar region: Secondary | ICD-10-CM | POA: Diagnosis not present

## 2024-09-02 DIAGNOSIS — G8929 Other chronic pain: Secondary | ICD-10-CM

## 2024-09-02 DIAGNOSIS — M5442 Lumbago with sciatica, left side: Secondary | ICD-10-CM

## 2024-09-02 DIAGNOSIS — M7062 Trochanteric bursitis, left hip: Secondary | ICD-10-CM | POA: Diagnosis not present

## 2024-09-02 NOTE — Assessment & Plan Note (Signed)
 Likely contributing as well.  Continue to monitor.  VMO strengthening exercises given.

## 2024-09-02 NOTE — Patient Instructions (Addendum)
 Xray today Epidural L3/L4 [phone] Injection in GT VMO ex Tru pull lite See me in 2-3 months

## 2024-09-02 NOTE — Assessment & Plan Note (Signed)
 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  I do think that this could be discussed the possibility of a referral.  This was ordered today.  Also having very specific pain over the greater trochanteric area that I think is more reactive secondary to weakness noted.  Patient does have a positive Trendelenburg but does show chronic weakness on the side.  Has done formal physical therapy and I do feel that patient's pelvic floor dysfunction is also contributing.  Discussed with patient about different treatment options and medications as well but patient would like to avoid that if possible.  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  Patient will hopefully get the epidural and then follow-up with me again in 2 to 3 months.

## 2024-09-02 NOTE — Assessment & Plan Note (Signed)
 Given injection today.  I do think that the weakness of the gluteal area though is what is likely contributing to more of the discomfort and pain.  Think this is also causing exacerbation of the bursitis.  Discussed icing regimen and home exercises, which activities to do and which ones to avoid.  Follow-up again in 6 to 8 weeks

## 2024-09-05 ENCOUNTER — Ambulatory Visit: Payer: Self-pay | Admitting: Family Medicine

## 2024-09-15 ENCOUNTER — Other Ambulatory Visit: Payer: Self-pay | Admitting: Obstetrics

## 2024-09-17 ENCOUNTER — Encounter: Payer: Self-pay | Admitting: Gastroenterology

## 2024-09-17 ENCOUNTER — Ambulatory Visit: Admitting: Gastroenterology

## 2024-09-17 VITALS — BP 96/58 | HR 62 | Ht 65.0 in | Wt 140.2 lb

## 2024-09-17 DIAGNOSIS — K5904 Chronic idiopathic constipation: Secondary | ICD-10-CM

## 2024-09-17 DIAGNOSIS — Z1211 Encounter for screening for malignant neoplasm of colon: Secondary | ICD-10-CM

## 2024-09-17 MED ORDER — SUTAB 1479-225-188 MG PO TABS: ORAL_TABLET | ORAL | 0 refills | Status: DC

## 2024-09-17 NOTE — Patient Instructions (Addendum)
 Constipation  Recommend high fiber diet Drink plenty of fluids Activity as tolertaed Samples of Linzess 72mcg 1 tab 30-45 mins before first meal of day with full glass of water. If works well please call  for prescription.   Please take the Mirafast week prior to colonoscopy to be sure things are clear.  You have been scheduled for a colonoscopy. Please follow written instructions given to you at your visit today.   If you use inhalers (even only as needed), please bring them with you on the day of your procedure.  DO NOT TAKE 7 DAYS PRIOR TO TEST- Trulicity (dulaglutide) Ozempic, Wegovy (semaglutide) Mounjaro, Zepbound (tirzepatide) Bydureon Bcise (exanatide extended release)  DO NOT TAKE 1 DAY PRIOR TO YOUR TEST Rybelsus (semaglutide) Adlyxin (lixisenatide) Victoza (liraglutide) Byetta (exanatide) ___________________________________________________________________________  Due to recent changes in healthcare laws, you may see the results of your imaging and laboratory studies on MyChart before your provider has had a chance to review them.  We understand that in some cases there may be results that are confusing or concerning to you. Not all laboratory results come back in the same time frame and the provider may be waiting for multiple results in order to interpret others.  Please give us  48 hours in order for your provider to thoroughly review all the results before contacting the office for clarification of your results.  _______________________________________________________  If your blood pressure at your visit was 140/90 or greater, please contact your primary care physician to follow up on this.  _______________________________________________________  If you are age 70 or older, your body mass index should be between 23-30. Your Body mass index is 23.34 kg/m. If this is out of the aforementioned range listed, please consider follow up with your Primary Care Provider.  If  you are age 80 or younger, your body mass index should be between 19-25. Your Body mass index is 23.34 kg/m. If this is out of the aformentioned range listed, please consider follow up with your Primary Care Provider.   ________________________________________________________  The [facility] providers would like to encourage you to use MYCHART to communicate with providers for non-urgent requests or questions.  Due to long hold times on the telephone, sending your provider a message by [facility] may be a faster and more efficient way to get a response.  Please allow 48 business hours for a response.  Please remember that this is for non-urgent requests.  _______________________________________________________  [facility] is using a team-based approach to care.  Your team is made up of your doctor and two to three APPS. Our APPS (Nurse Practitioners and Physician Assistants) work with your physician to ensure care continuity for you. They are fully qualified to address your health concerns and develop a treatment plan. They communicate directly with your gastroenterologist to care for you. Seeing the Advanced Practice Practitioners on your physician's team can help you by facilitating care more promptly, often allowing for earlier appointments, access to diagnostic testing, procedures, and other specialty referrals.   Thank you for trusting me with your gastrointestinal care. Deanna May, FNP-C

## 2024-09-17 NOTE — Progress Notes (Addendum)
 Chief Complaint:constipation Primary GI Doctor: Dr. Suzann  HPI:  Dawn Norris is a  78  year old female Dawn Norris with past medical history of thyroid  disease, constipation, history of TIA, and Paroxysmal atrial fibrillation (no anticoalagulants), who was referred to me by Katheen Roselie Rockford, NP on 08/25/24 for a evaluation of constipation .    01/15/24 seen by cardiology: for routine follow-up, reviewed entire note.  Interval History Dawn Norris presents for evaluation for chronic constipation and discuss scheduling colonoscopy.   She has tried and failed OTC stool softeners, and high fiber diet. She cannot tolerate liquid laxatives such as OTC Miralax.  She reports she was prescribed Amitiza  8mcg  and after only one dose she had severe diarrhea.   She takes MiraFast, 2 gummies at night every other night. She will have small hard rock type stools.   She has had multiple surgeries for prolapse.   Not on blood thinners.  Nonsmoker. 1 Glass of wine daily.   Surgical history:Cystocele repair; Tonsillectomy; Rectocele repair; and Vaginal hysterectomy (2024).   Dawn Norris's family history includes: No family history of Colon CA, no esophageal CA   Wt Readings from Last 3 Encounters:  09/17/24 140 lb 4 oz (63.6 kg)  09/02/24 146 lb (66.2 kg)  04/09/24 144 lb 6.4 oz (65.5 kg)    Past Medical History:  Diagnosis Date   History of TIA (transient ischemic attack) 01/26/2022   Thyroid  disease     Past Surgical History:  Procedure Laterality Date   CYSTOCELE REPAIR     RECTOCELE REPAIR     TONSILLECTOMY     VAGINAL HYSTERECTOMY  2024    Current Outpatient Medications  Medication Sig Dispense Refill   Estradiol  10 MCG TABS vaginal tablet INSERT 1 TABLET (10 MCG TOTAL) VAGINALLY EVERY NIGHT FOR 2 WEEKS, THEN INSERT 1 TABLET TWICE A WEEK AFTER 8 tablet 0   ibuprofen (ADVIL) 200 MG tablet Take 200 mg by mouth 3 (three) times daily as needed (pain).     levothyroxine  (SYNTHROID ) 50 MCG tablet  TAKE 1 TABLET BY MOUTH EVERY TUESDAY, THURSDAY, SATURDAY, AND SUNDAY. 48 tablet 1   levothyroxine  (SYNTHROID ) 75 MCG tablet TAKE 1 TABLET BY MOUTH ON MONDAY, WEDNESDAY AND FRIDAY 36 tablet 1   Multiple Vitamin (MULTIVITAMIN WITH MINERALS) TABS tablet Take 1 tablet by mouth every morning.     rosuvastatin  (CRESTOR ) 10 MG tablet Take 1 tablet (10 mg total) by mouth at bedtime. 90 tablet 3   Sodium Sulfate-Mag Sulfate-KCl (SUTAB) 1479-225-188 MG TABS Use as directed for colonoscopy. MANUFACTURER CODES!! BIN: [id number] PCN: CN GROUP: [id number] MEMBER ID: [id number];MLW AS SECONDARY INSURANCE ;NO PRIOR AUTHORIZATION 24 tablet 0   No current facility-administered medications for this visit.    Allergies as of 09/17/2024 - Review Complete 09/17/2024  Allergen Reaction Noted   Codeine Nausea And Vomiting 12/01/2009   Epinephrine Other (See Comments) 04/06/2015   Erythromycin Other (See Comments) 12/06/2011   Morphine Other (See Comments) 12/06/2011    Family History  Problem Relation Age of Onset   Heart attack Mother    Heart disease Mother    Ovarian cancer Neg Hx    Uterine cancer Neg Hx    Bladder Cancer Neg Hx     Review of Systems:    Constitutional: No weight loss, fever, chills, weakness or fatigue HEENT: Eyes: No change in vision               Ears, Nose, Throat:  No change in hearing or congestion  Skin: No rash or itching Cardiovascular: No chest pain, chest pressure or palpitations   Respiratory: No SOB or cough Gastrointestinal: See HPI and otherwise negative Genitourinary: No dysuria or change in urinary frequency Neurological: No headache, dizziness or syncope Musculoskeletal: No new muscle or joint pain Hematologic: No bleeding or bruising Psychiatric: No history of depression or anxiety    Physical Exam:  Vital signs: BP (!) 96/58 (BP Location: Left Arm, Dawn Norris Position: Sitting, Cuff Size: Normal)   Pulse 62   Ht 5' 5 (1.651 m)   Wt 140 lb 4 oz (63.6 kg)   BMI  23.34 kg/m   Constitutional:   Pleasant female appears to be in NAD, Well developed, Well nourished, alert and cooperative Eyes:   PEERL, EOMI. No icterus. Conjunctiva pink. Neck:  Supple Throat: Oral cavity and pharynx without inflammation, swelling or lesion.  Respiratory: Respirations even and unlabored. Lungs clear to auscultation bilaterally.   No wheezes, crackles, or rhonchi.  Cardiovascular: Normal S1, S2. Regular rate and rhythm. No peripheral edema, cyanosis or pallor.  Gastrointestinal:  Soft, nondistended, nontender. No rebound or guarding. Hypoactive bowel sounds. No appreciable masses or hepatomegaly. Rectal:  Not performed.  Msk:  Symmetrical without gross deformities. Without edema, no deformity or joint abnormality.  Neurologic:  Alert and  oriented x4;  grossly normal neurologically.  Skin:   Dry and intact without significant lesions or rashes.  RELEVANT LABS AND IMAGING: CBC    Latest Ref Rng & Units 10/25/2022   10:09 AM 06/01/2022   12:00 PM 01/21/2022    4:07 PM  CBC  WBC 4.0 - 10.5 K/uL 5.9  8.7    Hemoglobin 12.0 - 15.0 g/dL 86.1  85.8  86.0   Hematocrit 36.0 - 46.0 % 41.2  42.5  41.0   Platelets 150.0 - 400.0 K/uL 282.0  283.0       CMP     Latest Ref Rng & Units 03/17/2024   11:01 AM 10/25/2022   10:09 AM 01/21/2022    4:07 PM  CMP  Glucose 70 - 99 mg/dL 65  91  89   BUN 6 - 23 mg/dL 21  21  22    Creatinine 0.40 - 1.20 mg/dL 9.14  9.14  9.19   Sodium 135 - 145 mEq/L 139  142  142   Potassium 3.5 - 5.1 mEq/L 4.0  4.4  3.8   Chloride 96 - 112 mEq/L 102  103  104   CO2 19 - 32 mEq/L 31  32    Calcium  8.4 - 10.5 mg/dL 9.6  9.5    Total Protein 6.0 - 8.3 g/dL 6.9  6.7    Total Bilirubin 0.2 - 1.2 mg/dL 0.5  0.4    Alkaline Phos 39 - 117 U/L 50  46    AST 0 - 37 U/L 27  16    ALT 0 - 35 U/L 26  11       Lab Results  Component Value Date   TSH 3.81 03/17/2024  10/2022 echo-  Left ventricular ejection fraction, by estimation, is 60 to 65%.   04/05/12  colonoscopy at [facility] [place] for hematochezia and abd pain Digital examination of the rectum was negative. The video colonoscope was utilized. The prep was fair. Examination was carried easily to the base of the cecum, where I identified the appendiceal orifice and the ileocecal valve. Intubation of the ileum appeared unremarkable. The scope was then slowly withdrawn. The cecum, ascending colon, hepatic  flexure, and transverse colon were unremarkable. In the distal descending colon and sigmoid colon extending to approximately 25 cm, there were some patchy areas of rather significant, acute colitic changes with superficial ulcerations consistent with an acute colitis and probable ischemic colitis. This was biopsy sampled. In the rectum we do identify small internal hemorrhoids. The Dawn Norris appeared to tolerate the procedure well.   Repeat colonoscopy 6 mths later and resolved.   Assessment: Encounter Diagnoses  Name Primary?   Chronic idiopathic constipation Yes   Special screening for malignant neoplasms, colon     78 year old female Dawn Norris that presents with chronic constipation not relieved with over-the-counter laxatives, stool softeners or consuming high-fiber diet.  Dawn Norris was recently prescribed Amitiza  low-dose 8 mcg twice daily.  Dawn Norris reports she took 1 dose and had severe diarrhea and could not leave the house.  Will go ahead and provide samples of pro secretory agent Linzess 72 mcg to see if this is something she can tolerate better.  Recommended she Durenda Pechacek want to wait until after bowel purge from colonoscopy to start the medication.  Dawn Norris's last colonoscopy was in 2013 for hematochezia and abdominal pain and was diagnosed with acute colonic changes consistent with probable ischemic colitis.  Per Dawn Norris she had a repeat colonoscopy less than a year later with resolution of symptoms. Recall 10 years, due for colon screening colonoscopy will schedule today in [facility] with  Dr. Suzann.   Plan: - samples of Linzess 72 mcg po daily  -unable to do (2 day prep) liquid miralax- recommended she take her current laxative pills week prior to procedures. -Schedule for a colonoscopy in [facility] with Dr. Suzann. The risks and benefits of colonoscopy with possible polypectomy / biopsies were discussed and the Dawn Norris agrees to proceed.   Thank you for the courtesy of this consult. Please call me with any questions or concerns.   Ravinder Hofland, FNP-C [facility] 09/17/2024, 12:46 PM  Cc: Katheen Roselie Rockford, NP'  I have reviewed the clinic note as outlined by Cathryne Beal, NP and agree with the assessment, plan and medical decision making.  Ms. Ransdell presents to the office for evaluation of constipation.  Symptoms refractory to OTC medications.  Developed diarrhea with Amitiza .  Agree with trial of Linzess and scheduling updated colonoscopy.  Noted that previous colonoscopy showed changes potentially consistent with ischemic colitis.  Inocente Suzann, MD

## 2024-09-17 NOTE — Telephone Encounter (Signed)
 Office to reschedule follow-up visit for med refill. Will fill Rx one time

## 2024-09-19 ENCOUNTER — Other Ambulatory Visit

## 2024-09-25 ENCOUNTER — Other Ambulatory Visit: Payer: Self-pay | Admitting: Nurse Practitioner

## 2024-09-25 DIAGNOSIS — E039 Hypothyroidism, unspecified: Secondary | ICD-10-CM

## 2024-10-21 ENCOUNTER — Encounter: Payer: Self-pay | Admitting: Obstetrics

## 2024-10-21 ENCOUNTER — Ambulatory Visit: Admitting: Obstetrics

## 2024-10-21 VITALS — BP 125/80 | HR 63

## 2024-10-21 DIAGNOSIS — N952 Postmenopausal atrophic vaginitis: Secondary | ICD-10-CM | POA: Diagnosis not present

## 2024-10-21 DIAGNOSIS — N8110 Cystocele, unspecified: Secondary | ICD-10-CM | POA: Diagnosis not present

## 2024-10-21 DIAGNOSIS — K5900 Constipation, unspecified: Secondary | ICD-10-CM

## 2024-10-21 MED ORDER — ESTRADIOL 10 MCG VA TABS: 1.0000 | ORAL_TABLET | VAGINAL | 11 refills | Status: AC

## 2024-10-21 NOTE — Patient Instructions (Addendum)
 Continue vaginal estrogen twice a week.   Continue to increase mirafast dosing, consider daily use.

## 2024-10-21 NOTE — Progress Notes (Signed)
 [facility] Return Visit  SUBJECTIVE  History of Present Illness: Makaelah Cranfield is a 79 y.o. female seen in follow-up for stage II pelvic organ prolapse, constipation, vaginal atrophy, history of pelvic surgery, urinary frequency and vaginal discharge. Plan at last visit was treatment of constipation, continue low dose vaginal estrogen tablet, and vaginal moisturizer.   Pending colonoscopy tomorrow with Dr. Lynnann Tried miralax without relief Has not tried benefiber or gummies Using Mirafast every 2 days BM bristol IV every 2 days with reduced straining and denies splinting.  Sample of Linzess to start colonscopy  Switched from vaginal estrogen cream due to itching to vaginal insert with Vagifem . Reports reduction of dryness and 95% improvement in comfort  Metamucil powder caused gas and nausea with uses metamucil capsules 4/day with less gas. Uses 3 prunes intake with improved bowel frequency Similar symptoms from vaginal bulge.  Negative Nuswab 11/06/23 Denies gross hematuria Nervous about anesthesia, desires trial of pessary after improvement of constipation. Husband recently underwent surgery to repair hydrocele  Past Medical History: Patient  has a past medical history of History of TIA (transient ischemic attack) (01/26/2022) and Thyroid  disease.   Past Surgical History: She  has a past surgical history that includes Cystocele repair; Tonsillectomy; Rectocele repair; and Vaginal hysterectomy (2024).   Medications: She has a current medication list which includes the following prescription(s): ibuprofen, levothyroxine , levothyroxine , multivitamin with minerals, rosuvastatin , sutab , and [START ON 10/23/2024] estradiol .   Allergies: Patient is allergic to codeine, epinephrine, erythromycin, and morphine.   Social History: Patient  reports that she has quit smoking. Her smoking use included cigarettes. She has never used smokeless tobacco. She reports current alcohol  use. She reports that she does not use drugs.     OBJECTIVE     Physical Exam: Vitals:   10/21/24 1146  BP: 125/80  Pulse: 63    Gen: No apparent distress, A&O x 3.       ASSESSMENT AND PLAN    Ms. Towle is a 79 y.o. with:  1. Vaginal atrophy   2. Constipation, unspecified constipation type   3. Pelvic organ prolapse quantification stage 2 cystocele       Vaginal atrophy Assessment & Plan: - For symptomatic vaginal atrophy options include lubrication with a water-based lubricant, personal hygiene measures and barrier protection against wetness, and estrogen replacement in the form of vaginal cream, vaginal tablets, or a time-released vaginal ring.   - unable to tolerate vaginal estrogen cream due to itching - continue Vagifem  10mcg 2x/week due to relief with Rx provided - encouraged vaginal moisturizer  Orders: -     Estradiol ; Place 1 tablet (10 mcg total) vaginally 2 (two) times a week.  Dispense: 8 tablet; Refill: 11  Constipation, unspecified constipation type Assessment & Plan: - chronic issue, discussed likely related to recurrent prolapse - For constipation, we reviewed the importance of a better bowel regimen.  We also discussed the importance of avoiding chronic straining, as it can exacerbate her pelvic floor symptoms; we discussed treating constipation and straining prior to surgery, as postoperative straining can lead to damage to the repair and recurrence of symptoms. We discussed initiating therapy with increasing fluid intake, fiber supplementation, stool softeners, and laxatives such as miralax.  - encouraged squatting position for defecation and avoid straining - continue metamucil gummy (mirafast) and prunes, previously prefers capsule or tablet forms of fiber supplementation. Powder causes gas and nausea. Encouraged to increase to daily use to reassess symptoms - trial of 2 kiwis/day  rather than miralax due to nausea and gas - pending follow-up with GI  for colonoscopy and Rx for secretogogues to start after   Pelvic organ prolapse quantification stage 2 cystocele Assessment & Plan: - No OP note available from native tissue anterior/posterior repair in 2007 by Dr. Jerome in [place], [place]  - For treatment of pelvic organ prolapse, we discussed options for management including expectant management, conservative management, and surgical management, such as Kegels, a pessary, pelvic floor physical therapy, and specific surgical procedures. - We discussed two options for prolapse repair:  1) vaginal repair without mesh - Pros - safer, no mesh complications - Cons - not as strong as mesh repair, higher risk of recurrence  2) laparoscopic repair with mesh - Pros - stronger, better long-term success - Cons - risks of mesh implant (erosion into vagina or bladder, adhering to the rectum, pain) - these risks are lower than with a vaginal mesh but still exist - declines mesh use and previously desired to proceed with native tissue suspension - due to concerns with anesthesia, considered pessary trial after optimization of bowel consistency due to improvement in vaginal dryness and irritation since vaginal estrogen - continue vaginal estrogen use - reassess in 3 months per discussion with pt    Lianne ONEIDA Gillis, MD

## 2024-10-21 NOTE — Progress Notes (Unsigned)
 [facility] History and Physical   Primary Care Physician:  Nche, Roselie Rockford, NP   Reason for Procedure:  Change in bowel habits-constipation  Plan:    Colonoscopy   The patient was provided an opportunity to ask questions and all were answered. The patient agreed with the plan.   HPI: Dawn Norris is a 79 y.o. female undergoing colonoscopy for investigation of change in bowel habits.  Patient reports a history of constipation that has not responded to over-the-counter stool softeners and high-fiber diet.  Has not been able to tolerate OTC MiraLAX.  Developed diarrhea with Amitiza .    Last colonoscopy performed in 2013 in the setting of hematochezia and abdominal pain diagnosed changes suspicious for ischemic colitis.  Repeat colonoscopy 1 year later reported to have resolution of previous changes.  No family history of colon cancer or colon polyps.   Past Medical History:  Diagnosis Date   History of TIA (transient ischemic attack) 01/26/2022   Thyroid  disease     Past Surgical History:  Procedure Laterality Date   CYSTOCELE REPAIR     RECTOCELE REPAIR     TONSILLECTOMY     VAGINAL HYSTERECTOMY  2024    Prior to Admission medications  Medication Sig Start Date End Date Taking? Authorizing Provider  Estradiol  10 MCG TABS vaginal tablet INSERT 1 TABLET (10 MCG TOTAL) VAGINALLY EVERY NIGHT FOR 2 WEEKS, THEN INSERT 1 TABLET TWICE A WEEK AFTER 09/16/24   Guadlupe Dull T, MD  ibuprofen (ADVIL) 200 MG tablet Take 200 mg by mouth 3 (three) times daily as needed (pain).    [provider]  levothyroxine  (SYNTHROID ) 50 MCG tablet TAKE 1 TABLET BY MOUTH EVERY TUESDAY, THURSDAY, SATURDAY, AND SUNDAY. 09/26/24   Nche, Roselie Rockford, NP  levothyroxine  (SYNTHROID ) 75 MCG tablet TAKE 1 TABLET BY MOUTH ON MONDAY, WEDNESDAY AND FRIDAY 09/26/24   Nche, Roselie Rockford, NP  Multiple Vitamin (MULTIVITAMIN WITH MINERALS) TABS tablet Take 1 tablet by mouth every morning.     [provider]  rosuvastatin  (CRESTOR ) 10 MG tablet Take 1 tablet (10 mg total) by mouth at bedtime. 11/14/23   Nche, Roselie Rockford, NP  Sodium Sulfate-Mag Sulfate-KCl (SUTAB ) 1479-225-188 MG TABS Use as directed for colonoscopy. MANUFACTURER CODES!! BIN: [id number] PCN: CN GROUP: [id number] MEMBER ID: [id number];MLW AS SECONDARY INSURANCE ;NO PRIOR AUTHORIZATION 09/17/24   May, Deanna J, NP    Current Outpatient Medications  Medication Sig Dispense Refill   [START ON 10/23/2024] Estradiol  10 MCG TABS vaginal tablet Place 1 tablet (10 mcg total) vaginally 2 (two) times a week. 8 tablet 11   levothyroxine  (SYNTHROID ) 75 MCG tablet TAKE 1 TABLET BY MOUTH ON MONDAY, WEDNESDAY AND FRIDAY 36 tablet 0   Multiple Vitamin (MULTIVITAMIN WITH MINERALS) TABS tablet Take 1 tablet by mouth every morning.     rosuvastatin  (CRESTOR ) 10 MG tablet Take 1 tablet (10 mg total) by mouth at bedtime. 90 tablet 3   Sodium Sulfate-Mag Sulfate-KCl (SUTAB ) 1479-225-188 MG TABS Use as directed for colonoscopy. MANUFACTURER CODES!! BIN: [id number] PCN: CN GROUP: [id number] MEMBER ID: [id number];MLW AS SECONDARY INSURANCE ;NO PRIOR AUTHORIZATION 24 tablet 0   ibuprofen (ADVIL) 200 MG tablet Take 200 mg by mouth 3 (three) times daily as needed (pain).     levothyroxine  (SYNTHROID ) 50 MCG tablet TAKE 1 TABLET BY MOUTH EVERY TUESDAY, THURSDAY, SATURDAY, AND SUNDAY. 48 tablet 0   Current Facility-Administered Medications  Medication Dose Route Frequency Provider Last Rate Last Admin   0.9 %  sodium chloride   infusion  500 mL Intravenous Once Evangaline Jou M, MD        Allergies as of 10/22/2024 - Review Complete 10/22/2024  Allergen Reaction Noted   Codeine Nausea And Vomiting 12/01/2009   Epinephrine Other (See Comments) 04/06/2015   Erythromycin Other (See Comments) 12/06/2011   Morphine Other (See Comments) 12/06/2011    Family History  Problem Relation Age of Onset   Heart attack Mother    Heart disease Mother     Ovarian cancer Neg Hx    Uterine cancer Neg Hx    Bladder Cancer Neg Hx     Social History   Socioeconomic History   Marital status: Married    Spouse name: Not on file   Number of children: Not on file   Years of education: Not on file   Highest education level: Bachelor's degree (e.g., BA, AB, BS)  Occupational History   Not on file  Tobacco Use   Smoking status: Former    Types: Cigarettes   Smokeless tobacco: Never  Vaping Use   Vaping status: Never Used  Substance and Sexual Activity   Alcohol use: Yes    Comment: A glass of wine a night   Drug use: Never   Sexual activity: Not on file  Other Topics Concern   Not on file  Social History Narrative   Not on file   Social Drivers of Health   Tobacco Use: Medium Risk (10/22/2024)   Patient History    Smoking Tobacco Use: Former    Smokeless Tobacco Use: Never    Passive Exposure: Not on Actuary Strain: Low Risk (04/08/2024)   Overall Financial Resource Strain (CARDIA)    Difficulty of Paying Living Expenses: Not hard at all  Food Insecurity: No Food Insecurity (04/08/2024)   Epic    Worried About Radiation Protection Practitioner of Food in the Last Year: Never true    Ran Out of Food in the Last Year: Never true  Transportation Needs: No Transportation Needs (04/08/2024)   Epic    Lack of Transportation (Medical): No    Lack of Transportation (Non-Medical): No  Physical Activity: Insufficiently Active (04/08/2024)   Exercise Vital Sign    Days of Exercise per Week: 5 days    Minutes of Exercise per Session: 20 min  Stress: No Stress Concern Present (04/08/2024)   [employer] of Occupational Health - Occupational Stress Questionnaire    Feeling of Stress: Not at all  Social Connections: Socially Integrated (04/08/2024)   Social Connection and Isolation Panel    Frequency of Communication with Friends and Family: More than three times a week    Frequency of Social Gatherings with Friends and Family: Three times a  week    Attends Religious Services: More than 4 times per year    Active Member of Clubs or Organizations: Yes    Attends Banker Meetings: Patient declined    Marital Status: Married  Catering Manager Violence: Not At Risk (10/29/2023)   Humiliation, Afraid, Rape, and Kick questionnaire    Fear of Current or Ex-Partner: No    Emotionally Abused: No    Physically Abused: No    Sexually Abused: No  Depression (PHQ2-9): Low Risk (10/29/2023)   Depression (PHQ2-9)    PHQ-2 Score: 0  Alcohol Screen: Low Risk (04/08/2024)   Alcohol Screen    Last Alcohol Screening Score (AUDIT): 3  Housing: Low Risk (04/08/2024)   Epic    Unable to Pay for Housing  in the Last Year: No    Number of Times Moved in the Last Year: 0    Homeless in the Last Year: No  Utilities: Not At Risk (10/29/2023)   [employer] Utilities    Threatened with loss of utilities: No  Health Literacy: Adequate Health Literacy (10/29/2023)   B1300 Health Literacy    Frequency of need for help with medical instructions: Never    Review of Systems:  All other review of systems negative except as mentioned in the HPI.  Physical Exam: Vital signs BP (!) 142/86   Pulse 68   Temp (!) 97.5 F (36.4 C)   Ht 5' 5 (1.651 m)   Wt 140 lb (63.5 kg)   SpO2 97%   BMI 23.30 kg/m   General:   Alert,  Well-developed, well-nourished, pleasant and cooperative in NAD Airway:  Mallampati 3 Lungs:  Clear throughout to auscultation.   Heart:  Regular rate and rhythm; no murmurs, clicks, rubs,  or gallops. Abdomen:  Soft, nontender and nondistended. Normal bowel sounds.   Neuro/Psych:  Normal mood and affect. A and O x 3  Inocente Hausen, MD [facility]

## 2024-10-21 NOTE — Assessment & Plan Note (Signed)
-   For symptomatic vaginal atrophy options include lubrication with a water-based lubricant, personal hygiene measures and barrier protection against wetness, and estrogen replacement in the form of vaginal cream, vaginal tablets, or a time-released vaginal ring.   - unable to tolerate vaginal estrogen cream due to itching - continue Vagifem  10mcg 2x/week due to relief with Rx provided - encouraged vaginal moisturizer

## 2024-10-21 NOTE — Assessment & Plan Note (Signed)
-   No OP note available from native tissue anterior/posterior repair in 2007 by Dr. Jerome in [place], [place]  - For treatment of pelvic organ prolapse, we discussed options for management including expectant management, conservative management, and surgical management, such as Kegels, a pessary, pelvic floor physical therapy, and specific surgical procedures. - We discussed two options for prolapse repair:  1) vaginal repair without mesh - Pros - safer, no mesh complications - Cons - not as strong as mesh repair, higher risk of recurrence  2) laparoscopic repair with mesh - Pros - stronger, better long-term success - Cons - risks of mesh implant (erosion into vagina or bladder, adhering to the rectum, pain) - these risks are lower than with a vaginal mesh but still exist - declines mesh use and previously desired to proceed with native tissue suspension - due to concerns with anesthesia, considered pessary trial after optimization of bowel consistency due to improvement in vaginal dryness and irritation since vaginal estrogen - continue vaginal estrogen use - reassess in 3 months per discussion with pt

## 2024-10-21 NOTE — Assessment & Plan Note (Signed)
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  We also discussed the importance of avoiding chronic straining, as it can exacerbate her pelvic floor symptoms; we discussed treating constipation and straining prior to surgery, as postoperative straining can lead to damage to the repair and recurrence of symptoms. We discussed initiating therapy with increasing fluid intake, fiber supplementation, stool softeners, and laxatives such as miralax.  - encouraged squatting position for defecation and avoid straining - continue metamucil gummy (mirafast) and prunes, previously prefers capsule or tablet forms of fiber supplementation. Powder causes gas and nausea. Encouraged to increase to daily use to reassess symptoms - trial of 2 kiwis/day rather than miralax due to nausea and gas - pending follow-up with GI for colonoscopy and Rx for secretogogues to start after

## 2024-10-22 ENCOUNTER — Ambulatory Visit: Admitting: Pediatrics

## 2024-10-22 ENCOUNTER — Encounter: Payer: Self-pay | Admitting: Pediatrics

## 2024-10-22 VITALS — BP 95/50 | HR 62 | Temp 97.5°F | Resp 17 | Ht 65.0 in | Wt 140.0 lb

## 2024-10-22 DIAGNOSIS — K635 Polyp of colon: Secondary | ICD-10-CM

## 2024-10-22 DIAGNOSIS — K621 Rectal polyp: Secondary | ICD-10-CM | POA: Diagnosis not present

## 2024-10-22 DIAGNOSIS — Q439 Congenital malformation of intestine, unspecified: Secondary | ICD-10-CM | POA: Diagnosis not present

## 2024-10-22 DIAGNOSIS — Z1211 Encounter for screening for malignant neoplasm of colon: Secondary | ICD-10-CM

## 2024-10-22 DIAGNOSIS — K562 Volvulus: Secondary | ICD-10-CM

## 2024-10-22 DIAGNOSIS — D128 Benign neoplasm of rectum: Secondary | ICD-10-CM

## 2024-10-22 DIAGNOSIS — D123 Benign neoplasm of transverse colon: Secondary | ICD-10-CM

## 2024-10-22 MED ORDER — SODIUM CHLORIDE 0.9 % IV SOLN: 500.0000 mL | Freq: Once | INTRAVENOUS | Status: DC

## 2024-10-22 NOTE — Progress Notes (Signed)
 Pt's states no medical or surgical changes since previsit or office visit.

## 2024-10-22 NOTE — Patient Instructions (Addendum)
 Resume previous diet and  medications. Awaiting pathology results. Return to Clinic 12/19/24 8:30 am for follow up appointment.  YOU HAD AN ENDOSCOPIC PROCEDURE TODAY AT THE [facility]:   Refer to the procedure report that was given to you for any specific questions about what was found during the examination.  If the procedure report does not answer your questions, please call your gastroenterologist to clarify.  If you requested that your care partner not be given the details of your procedure findings, then the procedure report has been included in a sealed envelope for you to review at your convenience later.  YOU SHOULD EXPECT: Some feelings of bloating in the abdomen. Passage of more gas than usual.  Walking can help get rid of the air that was put into your GI tract during the procedure and reduce the bloating. If you had a lower endoscopy (such as a colonoscopy or flexible sigmoidoscopy) you may notice spotting of blood in your stool or on the toilet paper. If you underwent a bowel prep for your procedure, you may not have a normal bowel movement for a few days.  Please Note:  You might notice some irritation and congestion in your nose or some drainage.  This is from the oxygen used during your procedure.  There is no need for concern and it should clear up in a day or so.  SYMPTOMS TO REPORT IMMEDIATELY:  Following lower endoscopy (colonoscopy or flexible sigmoidoscopy):  Excessive amounts of blood in the stool  Significant tenderness or worsening of abdominal pains  Swelling of the abdomen that is new, acute  Fever of 100F or higher  For urgent or emergent issues, a gastroenterologist can be reached at any hour by calling [phone]. Do not use MyChart messaging for urgent concerns.    DIET:  We do recommend a small meal at first, but then you may proceed to your regular diet.  Drink plenty of fluids but you should avoid alcoholic beverages for 24 hours.  ACTIVITY:   You should plan to take it easy for the rest of today and you should NOT DRIVE or use heavy machinery until tomorrow (because of the sedation medicines used during the test).    FOLLOW UP: Our staff will call the number listed on your records the next business day following your procedure.  We will call around 7:15- 8:00 am to check on you and address any questions or concerns that you may have regarding the information given to you following your procedure. If we do not reach you, we will leave a message.     If any biopsies were taken you will be contacted by phone or by letter within the next 1-3 weeks.  Please call us  at [phone] if you have not heard about the biopsies in 3 weeks.    SIGNATURES/CONFIDENTIALITY: You and/or your care partner have signed paperwork which will be entered into your electronic medical record.  These signatures attest to the fact that that the information above on your After Visit Summary has been reviewed and is understood.  Full responsibility of the confidentiality of this discharge information lies with you and/or your care-partner.

## 2024-10-22 NOTE — Progress Notes (Signed)
 Report to PACU, RN, vss, BBS= Clear.

## 2024-10-22 NOTE — Op Note (Addendum)
 [facility] Patient Name: Dawn Norris Procedure Date: 10/22/2024 7:19 AM MRN: [id number] Endoscopist: Inocente Hausen , MD, [id number] Age: 79 Referring MD:  Date of Birth: 1946-04-12 Gender: Female Account #: [id number] Procedure:                Colonoscopy Indications:              Screening for colorectal malignant neoplasm, Last                            colonoscopy: 2014, Incidental constipation noted Medicines:                Monitored Anesthesia Care Procedure:                Pre-Anesthesia Assessment:                           - Prior to the procedure, a History and Physical                            was performed, and patient medications and                            allergies were reviewed. The patient's tolerance of                            previous anesthesia was also reviewed. The risks                            and benefits of the procedure and the sedation                            options and risks were discussed with the patient.                            All questions were answered, and informed consent                            was obtained. Prior Anticoagulants: The patient has                            taken no anticoagulant or antiplatelet agents. ASA                            Grade Assessment: II - A patient with mild systemic                            disease. After reviewing the risks and benefits,                            the patient was deemed in satisfactory condition to                            undergo the procedure.  After obtaining informed consent, the colonoscope                            was passed under direct vision. Throughout the                            procedure, the patient's blood pressure, pulse, and                            oxygen saturations were monitored continuously. The                            PCF-HQ190L Colonoscope [id number] was introduced                            through the  anus and advanced to the cecum,                            identified by appendiceal orifice and ileocecal                            valve. The colonoscopy was somewhat difficult due                            to significant looping and a tortuous colon.                            Successful completion of the procedure was aided by                            using manual pressure and straightening and                            shortening the scope to obtain bowel loop                            reduction. The patient tolerated the procedure                            well. The quality of the bowel preparation was                            good. The ileocecal valve, appendiceal orifice, and                            rectum were photographed. Scope In: 8:02:05 AM Scope Out: 8:23:48 AM Scope Withdrawal Time: 0 hours 15 minutes 19 seconds  Total Procedure Duration: 0 hours 21 minutes 43 seconds  Findings:                 The perianal and digital rectal examinations were                            normal. Pertinent negatives include normal  sphincter tone and no palpable rectal lesions.                           A 6 mm polyp was found in the transverse colon. The                            polyp was pedunculated. The polyp was removed with                            a cold snare. Resection and retrieval were complete.                           A 3 mm polyp was found in the rectum. The polyp was                            sessile. The polyp was removed with a cold biopsy                            forceps. Resection and retrieval were complete.                           The retroflexed view of the distal rectum and anal                            verge was normal and showed no anal or rectal                            abnormalities. Complications:            No immediate complications. Estimated blood loss:                            Minimal. Estimated Blood Loss:      Estimated blood loss was minimal. Impression:               - One 6 mm polyp in the transverse colon, removed                            with a cold snare. Resected and retrieved.                           - One 3 mm polyp in the rectum, removed with a cold                            biopsy forceps. Resected and retrieved. Recommendation:           - Discharge patient to home (ambulatory).                           - Await pathology results.                           - Return to clinic in 2-3 months with Dr. Suzann  or APP (Deanna May or Alan Coombs).                           - The findings and recommendations were discussed                            with the patient's family.                           - Patient has a contact number available for                            emergencies. The signs and symptoms of potential                            delayed complications were discussed with the                            patient. Return to normal activities tomorrow.                            Written discharge instructions were provided to the                            patient. Inocente Hausen, MD 10/22/2024 8:29:05 AM This report has been signed electronically.

## 2024-10-22 NOTE — Progress Notes (Signed)
 Called to room to assist during endoscopic procedure.  Patient ID and intended procedure confirmed with present staff. Received instructions for my participation in the procedure from the performing physician.

## 2024-10-23 ENCOUNTER — Telehealth: Payer: Self-pay

## 2024-10-23 NOTE — Telephone Encounter (Signed)
" °  Follow up Call-     10/22/2024    7:20 AM  Call back number  Post procedure Call Back phone  # [phone]  Permission to leave phone message Yes     Patient questions:  Do you have a fever, pain , or abdominal swelling? No. Pain Score  0 *  Have you tolerated food without any problems? Yes.    Have you been able to return to your normal activities? Yes.    Do you have any questions about your discharge instructions: Diet   No. Medications  No. Follow up visit  No.  Do you have questions or concerns about your Care? No.  Actions: * If pain score is 4 or above: No action needed, pain <4.   "

## 2024-10-24 LAB — SURGICAL PATHOLOGY

## 2024-10-26 ENCOUNTER — Ambulatory Visit: Payer: Self-pay | Admitting: Pediatrics

## 2024-11-04 ENCOUNTER — Other Ambulatory Visit: Payer: Self-pay | Admitting: Nurse Practitioner

## 2024-11-04 ENCOUNTER — Ambulatory Visit: Admitting: Family Medicine

## 2024-11-04 DIAGNOSIS — E7800 Pure hypercholesterolemia, unspecified: Secondary | ICD-10-CM

## 2024-11-06 ENCOUNTER — Encounter: Payer: Self-pay | Admitting: Nurse Practitioner

## 2024-11-11 ENCOUNTER — Encounter: Payer: Self-pay | Admitting: Nurse Practitioner

## 2024-11-18 ENCOUNTER — Ambulatory Visit

## 2024-11-19 ENCOUNTER — Encounter: Payer: Self-pay | Admitting: Nurse Practitioner

## 2024-11-19 ENCOUNTER — Ambulatory Visit: Admitting: Nurse Practitioner

## 2024-11-19 VITALS — BP 130/78 | HR 66 | Temp 98.0°F | Wt 138.4 lb

## 2024-11-19 DIAGNOSIS — I480 Paroxysmal atrial fibrillation: Secondary | ICD-10-CM

## 2024-11-19 DIAGNOSIS — E7800 Pure hypercholesterolemia, unspecified: Secondary | ICD-10-CM

## 2024-11-19 DIAGNOSIS — E039 Hypothyroidism, unspecified: Secondary | ICD-10-CM

## 2024-11-19 DIAGNOSIS — M810 Age-related osteoporosis without current pathological fracture: Secondary | ICD-10-CM

## 2024-11-19 MED ORDER — ROSUVASTATIN CALCIUM 10 MG PO TABS: 10.0000 mg | ORAL_TABLET | Freq: Every day | ORAL | 3 refills | Status: AC

## 2024-11-19 MED ORDER — LEVOTHYROXINE SODIUM 50 MCG PO TABS: ORAL_TABLET | ORAL | 1 refills | Status: AC

## 2024-11-19 MED ORDER — LEVOTHYROXINE SODIUM 75 MCG PO TABS: ORAL_TABLET | ORAL | 1 refills | Status: AC

## 2024-11-19 NOTE — Assessment & Plan Note (Signed)
 She declined use of Evenity or reclast. She has maintain calcium  and vit.D OVER THE COUNTER supplement. Low fall risk.

## 2024-11-19 NOTE — Assessment & Plan Note (Signed)
Repeat Tsh and T4 Maintain levothyroxine dose

## 2024-11-19 NOTE — Progress Notes (Signed)
 "               Established Patient Visit  Patient: Dawn Norris   DOB: 12/16/1945   79 y.o. Female  MRN: [id number] Visit Date: 11/19/2024  Subjective:    Chief Complaint  Patient presents with   Follow-up    6 month follow up    HPI Paroxysmal atrial fibrillation (HCC) Under the care of cardiology, hx of TIA and HTN CHADs Vas score:4 Previous use of xarelto , but she opted to discontinue med despite hypercoagulation risk. Today she denies any palpitation or SOB or syncope.   Hypothyroidism Repeat Tsh and T4 Maintain levothyroxine  dose  Pure hypercholesterolemia No adverse effects with crestor  10mg  Repeat lipid panel Maintain med dose  Osteoporosis She declined use of Evenity or reclast. She has maintain calcium  and vit.D OVER THE COUNTER supplement. Low fall risk.  Reviewed medical, surgical, and social history today  Medications: Show/hide medication list[1] Reviewed past medical and social history.   ROS per HPI above      Objective:  BP 130/78 (BP Location: Left Arm, Patient Position: Sitting, Cuff Size: Normal)   Pulse 66   Temp 98 F (36.7 C) (Oral)   Wt 138 lb 6.4 oz (62.8 kg)   SpO2 98%   BMI 23.03 kg/m      Physical Exam Vitals and nursing note reviewed.  Cardiovascular:     Rate and Rhythm: Normal rate.     Pulses: Normal pulses.  Pulmonary:     Effort: Pulmonary effort is normal.  Neurological:     Mental Status: She is alert and oriented to person, place, and time.     No results found for any visits on 11/19/24.    Assessment & Plan:    Problem List Items Addressed This Visit     Hypothyroidism - Primary   Repeat Tsh and T4 Maintain levothyroxine  dose      Relevant Medications   levothyroxine  (SYNTHROID ) 50 MCG tablet   levothyroxine  (SYNTHROID ) 75 MCG tablet   Other Relevant Orders   TSH   T4, free   Osteoporosis   She declined use of Evenity or reclast. She has maintain calcium  and vit.D OVER THE COUNTER  supplement. Low fall risk.      Paroxysmal atrial fibrillation (HCC)   Under the care of cardiology, hx of TIA and HTN CHADs Vas score:4 Previous use of xarelto , but she opted to discontinue med despite hypercoagulation risk. Today she denies any palpitation or SOB or syncope.       Relevant Medications   rosuvastatin  (CRESTOR ) 10 MG tablet   Pure hypercholesterolemia   No adverse effects with crestor  10mg  Repeat lipid panel Maintain med dose      Relevant Medications   rosuvastatin  (CRESTOR ) 10 MG tablet   Other Relevant Orders   Lipid panel   Return in about 6 months (around 05/19/2025) for hyperlipidemia (fasting), Hypothyroidism.     Roselie Mood, NP       [1]  Outpatient Medications Prior to Visit  Medication Sig   Estradiol  10 MCG TABS vaginal tablet Place 1 tablet (10 mcg total) vaginally 2 (two) times a week.   ibuprofen (ADVIL) 200 MG tablet Take 200 mg by mouth 3 (three) times daily as needed (pain).   Multiple Vitamin (MULTIVITAMIN WITH MINERALS) TABS tablet Take 1 tablet by mouth every morning.   [DISCONTINUED] levothyroxine  (SYNTHROID ) 50 MCG tablet TAKE 1 TABLET BY MOUTH EVERY TUESDAY, THURSDAY, SATURDAY, AND SUNDAY.   [DISCONTINUED] levothyroxine  (SYNTHROID )  75 MCG tablet TAKE 1 TABLET BY MOUTH ON MONDAY, WEDNESDAY AND FRIDAY   [DISCONTINUED] rosuvastatin  (CRESTOR ) 10 MG tablet Take 1 tablet (10 mg total) by mouth at bedtime.   [DISCONTINUED] Sodium Sulfate-Mag Sulfate-KCl (SUTAB ) 1479-225-188 MG TABS Use as directed for colonoscopy. MANUFACTURER CODES!! BIN: [id number] PCN: CN GROUP: [id number] MEMBER ID: [id number];[employer] AS SECONDARY INSURANCE ;NO PRIOR AUTHORIZATION (Patient not taking: Reported on 11/19/2024)   No facility-administered medications prior to visit.   "

## 2024-11-19 NOTE — Assessment & Plan Note (Signed)
 Under the care of cardiology, hx of TIA and HTN CHADs Vas score:4 Previous use of xarelto , but she opted to discontinue med despite hypercoagulation risk. Today she denies any palpitation or SOB or syncope.

## 2024-11-19 NOTE — Patient Instructions (Signed)
 Schedule fasting lab appt. Need to be fasting 8hrs prior to blood draw. Ok to drink water and take thyroid  meds. Maintain Heart healthy diet and daily exercise. Maintain current medications.

## 2024-11-19 NOTE — Assessment & Plan Note (Signed)
 No adverse effects with crestor  10mg  Repeat lipid panel Maintain med dose

## 2024-11-25 ENCOUNTER — Other Ambulatory Visit

## 2024-11-27 ENCOUNTER — Ambulatory Visit: Admitting: Family Medicine

## 2024-12-19 ENCOUNTER — Ambulatory Visit: Admitting: Pediatrics

## 2025-01-13 ENCOUNTER — Ambulatory Visit

## 2025-01-15 ENCOUNTER — Ambulatory Visit: Admitting: Nurse Practitioner

## 2025-01-20 ENCOUNTER — Ambulatory Visit: Admitting: Obstetrics

## 2025-05-19 ENCOUNTER — Ambulatory Visit: Admitting: Nurse Practitioner
